# Patient Record
Sex: Female | Born: 1955 | Race: Black or African American | Hispanic: No | Marital: Married | Smoking: Never smoker
Health system: Southern US, Community
[De-identification: ages and names within clinical notes are randomized; demographics above are authoritative.]

## PROBLEM LIST (undated history)

## (undated) DIAGNOSIS — E669 Obesity, unspecified: Secondary | ICD-10-CM

## (undated) DIAGNOSIS — K429 Umbilical hernia without obstruction or gangrene: Secondary | ICD-10-CM

## (undated) DIAGNOSIS — I10 Essential (primary) hypertension: Secondary | ICD-10-CM

## (undated) DIAGNOSIS — K589 Irritable bowel syndrome without diarrhea: Secondary | ICD-10-CM

## (undated) HISTORY — DX: Obesity, unspecified: E66.9

## (undated) HISTORY — PX: WISDOM TOOTH EXTRACTION: SHX21

## (undated) HISTORY — PX: COLONOSCOPY: SHX174

## (undated) HISTORY — PX: LASER ABLATION OF THE CERVIX: SHX1949

## (undated) HISTORY — DX: Irritable bowel syndrome, unspecified: K58.9

## (undated) HISTORY — PX: ABDOMINAL HYSTERECTOMY: SHX81

## (undated) HISTORY — DX: Umbilical hernia without obstruction or gangrene: K42.9

## (undated) HISTORY — DX: Essential (primary) hypertension: I10

---

## 1999-03-30 ENCOUNTER — Other Ambulatory Visit: Admission: RE | Admit: 1999-03-30 | Discharge: 1999-03-30 | Payer: Self-pay | Admitting: *Deleted

## 2000-05-19 ENCOUNTER — Other Ambulatory Visit: Admission: RE | Admit: 2000-05-19 | Discharge: 2000-05-19 | Payer: Self-pay | Admitting: *Deleted

## 2000-09-13 ENCOUNTER — Encounter: Admission: RE | Admit: 2000-09-13 | Discharge: 2000-12-12 | Payer: Self-pay | Admitting: *Deleted

## 2004-08-25 ENCOUNTER — Other Ambulatory Visit: Admission: RE | Admit: 2004-08-25 | Discharge: 2004-08-25 | Payer: Self-pay | Admitting: Family Medicine

## 2005-02-20 ENCOUNTER — Emergency Department (HOSPITAL_COMMUNITY): Admission: EM | Admit: 2005-02-20 | Discharge: 2005-02-20 | Payer: Self-pay | Admitting: Emergency Medicine

## 2008-06-03 ENCOUNTER — Encounter: Admission: RE | Admit: 2008-06-03 | Discharge: 2008-06-03 | Payer: Self-pay | Admitting: Obstetrics

## 2008-10-18 ENCOUNTER — Encounter (INDEPENDENT_AMBULATORY_CARE_PROVIDER_SITE_OTHER): Payer: Self-pay | Admitting: Obstetrics & Gynecology

## 2008-10-18 ENCOUNTER — Ambulatory Visit (HOSPITAL_COMMUNITY): Admission: RE | Admit: 2008-10-18 | Discharge: 2008-10-18 | Payer: Self-pay | Admitting: Obstetrics & Gynecology

## 2009-04-25 ENCOUNTER — Emergency Department (HOSPITAL_COMMUNITY): Admission: EM | Admit: 2009-04-25 | Discharge: 2009-04-25 | Payer: Self-pay | Admitting: Emergency Medicine

## 2009-06-20 ENCOUNTER — Inpatient Hospital Stay (HOSPITAL_BASED_OUTPATIENT_CLINIC_OR_DEPARTMENT_OTHER): Admission: RE | Admit: 2009-06-20 | Discharge: 2009-06-20 | Payer: Self-pay | Admitting: Cardiology

## 2011-03-23 LAB — URINALYSIS, ROUTINE W REFLEX MICROSCOPIC
Bilirubin Urine: NEGATIVE
Glucose, UA: NEGATIVE mg/dL
Protein, ur: NEGATIVE mg/dL
Urobilinogen, UA: 0.2 mg/dL (ref 0.0–1.0)
pH: 5.5 (ref 5.0–8.0)

## 2011-03-23 LAB — POCT I-STAT, CHEM 8
BUN: 10 mg/dL (ref 6–23)
Calcium, Ion: 1.2 mmol/L (ref 1.12–1.32)
Chloride: 103 mEq/L (ref 96–112)
Glucose, Bld: 114 mg/dL — ABNORMAL HIGH (ref 70–99)
HCT: 44 % (ref 36.0–46.0)
Potassium: 3.5 mEq/L (ref 3.5–5.1)

## 2011-03-23 LAB — CBC
HCT: 40.3 % (ref 36.0–46.0)
MCHC: 33 g/dL (ref 30.0–36.0)
Platelets: 283 10*3/uL (ref 150–400)
RDW: 14.5 % (ref 11.5–15.5)
WBC: 11 10*3/uL — ABNORMAL HIGH (ref 4.0–10.5)

## 2011-03-23 LAB — DIFFERENTIAL
Basophils Absolute: 0.1 10*3/uL (ref 0.0–0.1)
Basophils Relative: 1 % (ref 0–1)
Eosinophils Absolute: 0.1 10*3/uL (ref 0.0–0.7)
Lymphs Abs: 1.7 10*3/uL (ref 0.7–4.0)
Monocytes Absolute: 0.5 10*3/uL (ref 0.1–1.0)
Monocytes Relative: 4 % (ref 3–12)
Neutrophils Relative %: 79 % — ABNORMAL HIGH (ref 43–77)

## 2011-03-23 LAB — TROPONIN I: Troponin I: 0.01 ng/mL (ref 0.00–0.06)

## 2011-03-23 LAB — POCT CARDIAC MARKERS

## 2011-03-23 LAB — URINE CULTURE: Colony Count: NO GROWTH

## 2011-03-23 LAB — CK TOTAL AND CKMB (NOT AT ARMC)
CK, MB: 0.9 ng/mL (ref 0.3–4.0)
Relative Index: 0.8 (ref 0.0–2.5)

## 2011-03-31 ENCOUNTER — Encounter (HOSPITAL_COMMUNITY)
Admission: RE | Admit: 2011-03-31 | Discharge: 2011-03-31 | Disposition: A | Discharge status: Home / Self Care | Payer: BC Managed Care – HMO | Source: Ambulatory Visit | Attending: Obstetrics & Gynecology | Admitting: Obstetrics & Gynecology

## 2011-03-31 LAB — BASIC METABOLIC PANEL
Calcium: 9 mg/dL (ref 8.4–10.5)
Creatinine, Ser: 0.44 mg/dL (ref 0.4–1.2)
GFR calc non Af Amer: >60 mL/min (ref >60)
Sodium: 141 mEq/L (ref 135–145)

## 2011-03-31 LAB — SURGICAL PCR SCREEN: MRSA, PCR: NEGATIVE

## 2011-03-31 LAB — CBC
HCT: 36.8 % (ref 36.0–46.0)
MCHC: 32.3 g/dL (ref 30.0–36.0)
Platelets: 246 10*3/uL (ref 150–400)
WBC: 6 10*3/uL (ref 4.0–10.5)

## 2011-04-05 ENCOUNTER — Other Ambulatory Visit: Payer: Self-pay | Admitting: Obstetrics & Gynecology

## 2011-04-05 ENCOUNTER — Ambulatory Visit (HOSPITAL_COMMUNITY)
Admission: RE | Admit: 2011-04-05 | Discharge: 2011-04-06 | Disposition: A | Discharge status: Home / Self Care | Payer: BC Managed Care – HMO | Source: Ambulatory Visit | Attending: Obstetrics & Gynecology | Admitting: Obstetrics & Gynecology

## 2011-04-05 DIAGNOSIS — K429 Umbilical hernia without obstruction or gangrene: Secondary | ICD-10-CM | POA: Insufficient documentation

## 2011-04-05 DIAGNOSIS — N949 Unspecified condition associated with female genital organs and menstrual cycle: Secondary | ICD-10-CM | POA: Insufficient documentation

## 2011-04-05 DIAGNOSIS — N920 Excessive and frequent menstruation with regular cycle: Secondary | ICD-10-CM | POA: Insufficient documentation

## 2011-04-05 DIAGNOSIS — D259 Leiomyoma of uterus, unspecified: Secondary | ICD-10-CM | POA: Insufficient documentation

## 2011-04-05 LAB — PREGNANCY, URINE: Preg Test, Ur: NEGATIVE

## 2011-04-05 LAB — GLUCOSE, CAPILLARY
Glucose-Capillary: 167 mg/dL — ABNORMAL HIGH (ref 70–99)
Glucose-Capillary: 213 mg/dL — ABNORMAL HIGH (ref 70–99)

## 2011-04-06 LAB — TYPE AND SCREEN

## 2011-04-27 NOTE — Cardiovascular Report (Signed)
[name], [name]          ACCOUNT NO.:  [id number]   MEDICAL RECORD NO.:  [id number]          PATIENT TYPE:  OIB   LOCATION:  1961                         FACILITY:  [facility]   PHYSICIAN:  Jake Bathe, MD      DATE OF BIRTH:  1956-06-08   DATE OF PROCEDURE:  06/20/2009  DATE OF DISCHARGE:  06/20/2009                            CARDIAC CATHETERIZATION   PROCEDURES:  1. Left heart catheterization.  2. Selective coronary angiography.  3. Left ventriculogram.  4. Abdominal aortogram.  5. Unilateral arteriogram of right radial artery.   PROCEDURE DETAILS:  Informed consent was obtained.  Risk of stroke,  heart attack, death, renal impairment, impairment of radial artery,  damage to artery and hand have been explained to the patient at length.  She was prepped in a sterile fashion and placed on the catheterization  table.  Her right radial artery was prepped initially.  Lidocaine 1% was  used for local anesthesia.  A 5-French hydrophilic sheath was easily  placed into the right radial artery.  Verapamil 3 mg were administered.  A Rosen wire was used and had a difficulty at the brachial junction.  An  arteriogram was performed using Omnipaque.  This demonstrated a radial  loop and a recurrent radial artery.  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  This was also discussed with Dr. Excell Seltzer.  At  this time, radial artery catheterization was discontinued.  Her radial  artery sheath was removed and RadStat was applied.  She had good oxygen  saturation per pulse oximetry on phone.  No forearm pain.   The right groin was then prepped.  Lidocaine 1% was used for local  anesthesia after first visualization of femoral head.  A 4-French sheath  was placed in the right femoral artery with ease using the modified  Seldinger technique.  A Judkins left #4 catheter and a new torque  Williams right catheter was used for visualization of the coronary  arteries.  Multiple views  of the Omnipaque were obtained.  An angled  pigtail was used to cross the left ventricle.  Power injection in the  RAO position utilizing 30 mL of contrast was obtained.  Ascending aortic  pressure was obtained.  The catheter was then placed at the level of L1  spine and an abdominal aortogram was then performed utilizing 30 mL of  contrast.  Following the procedure, the right femoral groin sheath was  removed.  Pressure maintained.  She tolerated the procedures well.  No  complaints.   FINDINGS:  No angiographically significant coronary artery disease  throughout all coronary arteries.  Normal anatomic distribution.  LAD  gives rise to one large diagonal branch.  Circumflex gives rise to one  large obtuse marginal branch.  The right coronary artery is the dominant  artery giving rise to the PDA.  No angiographically significant coronary  artery disease.  Left ventriculogram demonstrated normal ejection  fraction of 65% with no wall motion abnormalities present.  There was no  appreciable mitral regurgitation.  Left ventricular pressure was 127  with an end-diastolic pressure of 30 mmHg,  aortic pressure was 177/99  with a mean of 131 mmHg.   Abdominal aortogram demonstrated no renal artery stenosis and no  abdominal aortic aneurysm.   Arteriogram of right arm.  There was a radial loop identified.  There  was stenosis in the recurrent radial artery leading up her forearm upper  upper extremity.   IMPRESSION:  1. No angiographically significant coronary artery disease.  2. Normal left ventricular ejection fraction of 65%.  3. Elevated blood pressure/hypertension with left ventricular end-      diastolic pressure of 30 mmHg consistent with diastolic      dysfunction/hypertension.  4. No abdominal aortic aneurysm.  5. No evidence of renal artery stenosis given her hypertension.  6. Radial arteriogram as described above.   PLAN:  She will follow up with me in clinic.  Continue with  aggressive  risk factor modification which includes an aggressive blood pressure  control.  Hydralazine 10 mg has been administered for sheath pull.      Jake Bathe, MD  Electronically Signed     MCS/MEDQ  D:  06/20/2009  T:  06/21/2009  Job:  [id number]   cc:   Dario Guardian, M.D.

## 2011-04-27 NOTE — Op Note (Signed)
[name], [name]          ACCOUNT NO.:  [id number]   MEDICAL RECORD NO.:  [id number]          PATIENT TYPE:  AMB   LOCATION:  SDC                           FACILITY:  [facility]   PHYSICIAN:  Genia Del, M.D.DATE OF BIRTH:  05/11/1956   DATE OF PROCEDURE:  10/18/2008  DATE OF DISCHARGE:                               OPERATIVE REPORT   PREOPERATIVE DIAGNOSES:  Menorrhagia and intrauterine lesion.   POSTOPERATIVE DIAGNOSES:  Menorrhagia and intrauterine lesion.  No  endometrial polyp.   PROCEDURE:  Hysteroscopy, dilatation and curettage, NovaSure endometrial  ablation.   SURGEON:  Genia Del, MD   ANESTHESIOLOGIST:  Belva Agee, MD   DESCRIPTION OF PROCEDURE:  Under general anesthesia with endotracheal  intubation, the patient is in lithotomy position.  She is prepped with  Betadine on the suprapubic, vulvar, and vaginal areas and draped as  usual.  The vaginal exam under general anesthesia reveals an anteverted  uterus, no adnexal mass.  The speculum was introduced in the vagina.  The anterior lip of the cervix was grasped with a tenaculum.  A  paracervical block is done with Nesacaine 1%, 20 mL total, at 4 and 8  o'clock.  We then dilated the cervix with Hegar dilators up to #31  without difficulty.  We then measured the cervical length, which is 5  cm, and the hysterometry at 10 cm for a cavity length of 5 cm.  We then  proceeded with the hysteroscopy, revealing a normal intrauterine cavity.  Both ostia are visualized and pictures are taken.  No endometrial polyp  is present.  We therefore removed the hysteroscope, proceed with  curettage of the intrauterine cavity systematically on all surfaces with  a sharp curette.  The endometrial curettings are sent to pathology.  We  then insert the NovaSure instrument in the intrauterine cavity.  We  measure the width, which is at 3.8 cm.  We do the integrity test, which  is passed, and proceed with the ablation.   The duration is 45 seconds at  a power of 105.  The instrument is then removed without any difficulty.  All instruments are removed and hemostasis is adequate.  The estimated  blood loss was minimal.  No complications occurred, and the patient was  brought to the recovery room in good stable status.      Genia Del, M.D.  Electronically Signed     ML/MEDQ  D:  10/18/2008  T:  10/18/2008  Job:  [id number]

## 2011-05-12 NOTE — Op Note (Signed)
[name], [name]          ACCOUNT NO.:  [id number]  MEDICAL RECORD NO.:  [id number]           PATIENT TYPE:  O  LOCATION:  9310                          FACILITY:  [facility]  PHYSICIAN:  Genia Del, M.D.DATE OF BIRTH:  Mar 26, 1956  DATE OF PROCEDURE:  04/05/2011 DATE OF DISCHARGE:                              OPERATIVE REPORT   The patient came to [facility] for surgery on April 05, 2011.  PREOPERATIVE DIAGNOSIS:  Symptomatic large uterine myomas with menorrhagia and pelvic pain.  POSTOPERATIVE DIAGNOSES:  Symptomatic large uterine myomas with menorrhagia and pelvic pain, omental and bowel adhesions, and a less than 2-cm umbilical hernia.  PROCEDURE:  Total laparoscopy hysterectomy assisted with da Vinci with lysis of adhesions, repair of the umbilical hernia, and morcellation of the uterus.  SURGEON:  Genia Del, MD  ASSISTANT:  Maxie Better, MD  PROCEDURE:  Under general anesthesia with endotracheal intubation, the patient is in lithotomy position.  She is prepped with Surgi-Prep on the abdomen and with Betadine on the suprapubic vulvar and vaginal areas.  The patient is draped as usual.  The Foley is put in place in the bladder.  The vaginal exam reveals a large irregular uterus corresponding to about 15 weeks.  It is mobile, no adnexal mass, otherwise, felt.  We inserted the weighted speculum in the vagina.  We grasped the anterior lip of the cervix with a tenaculum.  We did hysterometry which is at 12 cm.  We dilated the cervix with Hegar dilators up to #29 without difficulty.  We then used a #10 RUMI with the medium Koh ring, this was put in place without difficulty.  The other instruments were then removed.  We go to the abdomen, we assured that a nasogastric tube is in place and draining well.  We made a very small skin incision with a scalpel below the left lower ribs.  We inserted the Veress needle at that level.  The security test was  done and we created pneumoperitoneum with CO2.  The pressures are normal.  We then removed the Veress needle and inserted a 5-mm trocar.  We then used a 5-mm camera at that level.  We inspected the abdominopelvic cavities.  We note the omentum inserted at the umbilical site, a small umbilical hernia is present.  Otherwise, the uterus is very irregular and enlarged with fibroids.  Filmy adhesions between the bowels and the uterus and the pelvic wall are present.  We measured the port entries, we will use an M configuration with two robotic ports on the right, one robotic port on the left, and the camera at the supraumbilical area.  We marked the skin.  We then infiltrated Marcaine 0.25 plain.  We inserted the three robotic ports under direct vision with a 5-mm camera.  We then used laparoscopic instruments to free the omentum at the supraumbilical area. We used a clamp and the hot scissors.  Once this was completed, we made a 10-cm incision at the supraumbilical area and entered the camera port under direct vision.  We then docked the robot on the right side.  We put the camera and the instruments  in place.  We used Trendelenburg as required, not maximum.  The instruments were inserted, the Endoshears scissor in the right and the PK in the left and the Cobra in the third arm.  Those instruments were connected.  We started at the console, went first to the right adnexa, visualized the ureter which was in normal location, released the filmy adhesions between the bowels and the right ovary, cauterized and sectioned the right round ligament, the right tube, and the right utero-ovarian ligament and went down and stopped just before the uterine artery.  We opened the visceral peritoneum anteriorly.  We proceeded then to the left side, cauterized, and sectioned the left tube, left utero-ovarian ligament, and left round ligament, visualized the left ureter which was in normal location.  We then  descended on the left lateral side of the uterus, opened the anterior visceral peritoneum, lowered the bladder past the [facility] ring.  We then cauterized and sectioned the left uterine artery followed by the right uterine artery.  The uterus was blanching well.  We then opened the colpotomy with the point of the Endoshears scissors, starting anteriorly going to the right side and then posteriorly finishing on the left side.  Once the uterus was completely detached, the [facility] ring was removed and the uterus was put in the right gutter.  The instruments were switched to the cutting needle driver in the right hand, the regular needle driver in the left and the PK in the third arm.  We used a V-lock, a suture, to close the colpotomy.  This suture was introduced through the suprapubic camera port under direct vision with a 5-mm camera and the assistant port.  We suctioned the pelvic cavity and started from the left angle to the right coming back with V-lock more than a half way to the other side.  Good bites were taken and the vaginal mucosa was included.  We had good hemostasis.  Hemostasis was verified also on the adnexa and was adequate.  We parked the needle on the right gutter peritoneum.  We removed the robotic instruments, undocked the robot, and went by laparoscopy.  First, we removed the needle through the suprapubic camera port.  We then removed that port and switched to the morcellator at that level and the 5-mm camera in the assistant port.  We morcellated the uterus easily.  The specimens weight was 501 g, and we sent it to Pathology.  We then irrigated and suctioned the abdominopelvic cavities, assured that no piece of myoma was left in. We reverified hemostasis which was adequate.  We removed all instruments, evacuated the CO2.  We closed fast the supraumbilical incision.  We repaired the hernia at that level closing with figure-of- eights of Vicryl 0.  No mesh was used given the small  size of the hernia.  We then closed all incisions with a Vicryl 4-0 and a subcuticular stitch and added Dermabond.  The occluder was removed from the vagina.  The estimated blood loss was 125 mL.  The patient was kept on the dry side during surgery.  Note that she received Flagyl and gentamicin IV before induction.  No complications occurred, and she was transferred to recovery room in good stable status.     Genia Del, M.D.     ML/MEDQ  D:  04/05/2011  T:  04/06/2011  Job:  [id number]  Electronically Signed by Genia Del M.D. on 05/12/2011 05:03:48 PM

## 2011-09-14 LAB — COMPREHENSIVE METABOLIC PANEL
Albumin: 3.5
BUN: 10
Calcium: 9.2
Creatinine, Ser: 0.68
Total Bilirubin: 0.5
Total Protein: 6.4

## 2011-09-14 LAB — CBC
HCT: 36.9
MCHC: 32.1
MCV: 88.1
Platelets: 280
RDW: 14.3

## 2011-09-22 ENCOUNTER — Encounter (INDEPENDENT_AMBULATORY_CARE_PROVIDER_SITE_OTHER): Payer: Self-pay | Admitting: General Surgery

## 2011-09-22 ENCOUNTER — Ambulatory Visit (INDEPENDENT_AMBULATORY_CARE_PROVIDER_SITE_OTHER): Payer: BC Managed Care – HMO | Admitting: General Surgery

## 2011-09-22 VITALS — BP 132/96 | HR 60 | Temp 96.8°F | Resp 20 | Ht 68.0 in | Wt 261.0 lb

## 2011-09-22 DIAGNOSIS — K429 Umbilical hernia without obstruction or gangrene: Secondary | ICD-10-CM | POA: Insufficient documentation

## 2011-09-22 NOTE — Progress Notes (Signed)
Chief Complaint  Patient presents with  . Other    eval umbilical hernia     HPI [name] is a 55 y.o. female.   HPI  She is referred by Dr. Katrinka Blazing for evaluation of an umbilical hernia. She had a robotic hysterectomy in April of this year. An umbilical hernia was found at that time and repaired with Vicryl suture. She first noted an intermittent bulge that was intermittently uncomfortable. The bulge is persistent now.  It has been present since her surgery in April. There are no obstructive symptoms. It has increased in size.  Past Medical History  Diagnosis Date  . Diabetes mellitus   . Hypertension   . Obesity   . IBS (irritable bowel syndrome)     Past Surgical History  Procedure Date  . Abdominal hysterectomy APril j23, 2012  . Colonoscopy   . Laser ablation of the cervix   . Wisdom tooth extraction     History reviewed. No pertinent family history.  Social History History  Substance Use Topics  . Smoking status: Never Smoker   . Smokeless tobacco: Never Used  . Alcohol Use: No    Allergies  Allergen Reactions  . Amoxicillin     Hives- itching and swelling     Current Outpatient Prescriptions  Medication Sig Dispense Refill  . amLODipine (NORVASC) 10 MG tablet       . BIOTIN PO Take by mouth daily.        Marland Kitchen JANUMET 50-1000 MG per tablet       . losartan-hydrochlorothiazide (HYZAAR) 100-25 MG per tablet       . Multiple Vitamin (MULTIVITAMIN PO) Take by mouth daily.        Marland Kitchen SIMVASTATIN PO Take by mouth daily.          Review of Systems Review of Systems  Constitutional: Positive for unexpected weight change.  Respiratory: Negative.   Cardiovascular: Negative.   Gastrointestinal:       Has intermittent diarrhea and nausea.    Blood pressure 132/96, pulse 60, temperature 96.8 F (36 C), resp. rate 20, height 5\' 8"  (1.727 m), weight 261 lb (118.389 kg).  Physical Exam Physical Exam  Constitutional: No distress.       Obese.  Neck: Neck  supple.  Cardiovascular: Normal rate and regular rhythm.   No murmur heard. Pulmonary/Chest: Effort normal and breath sounds normal.  Abdominal: Soft. She exhibits no distension. There is no tenderness.       There is an umbilical bulge that is reducible in the supine position. Small abdominal scars are noted.  Musculoskeletal: Normal range of motion. She exhibits no edema.  Lymphadenopathy:    She has no cervical adenopathy.    Data Reviewed Notes from Dr. Michaelle Copas office.  Assessment    Symptomatic umbilical hernia. She is interested in repair. I think this is a better alternative for her than nonoperative management. I feel operative management will help solve this problem.    Plan    I have discussed the procedure, risks, and aftercare.  Risks include but are not limited to bleeding, infection, wound problems, anesthesia, recurrence, injury to intestine, mesh problems.  We also discussed not knowing what the umbilicus would look like in the future.  She seems to understand and agrees with the plan.       Xaviar Lunn J 09/22/2011, 12:29 PM

## 2011-10-07 ENCOUNTER — Ambulatory Visit (HOSPITAL_COMMUNITY)
Admission: RE | Admit: 2011-10-07 | Discharge: 2011-10-07 | Disposition: A | Discharge status: Home / Self Care | Payer: BC Managed Care – PPO | Source: Ambulatory Visit | Attending: General Surgery | Admitting: General Surgery

## 2011-10-07 ENCOUNTER — Encounter (HOSPITAL_COMMUNITY): Payer: BC Managed Care – PPO

## 2011-10-07 ENCOUNTER — Other Ambulatory Visit (INDEPENDENT_AMBULATORY_CARE_PROVIDER_SITE_OTHER): Payer: Self-pay | Admitting: General Surgery

## 2011-10-07 DIAGNOSIS — Z01812 Encounter for preprocedural laboratory examination: Secondary | ICD-10-CM | POA: Insufficient documentation

## 2011-10-07 DIAGNOSIS — Z01818 Encounter for other preprocedural examination: Secondary | ICD-10-CM

## 2011-10-07 LAB — COMPREHENSIVE METABOLIC PANEL
ALT: 19 U/L (ref 0–35)
Alkaline Phosphatase: 62 U/L (ref 39–117)
CO2: 30 mEq/L (ref 19–32)
Calcium: 10.5 mg/dL (ref 8.4–10.5)
GFR calc Af Amer: >90 mL/min (ref >90)
GFR calc non Af Amer: >90 mL/min (ref >90)
Glucose, Bld: 136 mg/dL — ABNORMAL HIGH (ref 70–99)
Sodium: 141 mEq/L (ref 135–145)

## 2011-10-07 LAB — CBC
HCT: 39.2 % (ref 36.0–46.0)
Hemoglobin: 12.7 g/dL (ref 12.0–15.0)
MCV: 84.8 fL (ref 78.0–100.0)
Platelets: 242 10*3/uL (ref 150–400)
RBC: 4.62 MIL/uL (ref 3.87–5.11)
WBC: 6 10*3/uL (ref 4.0–10.5)

## 2011-10-07 LAB — DIFFERENTIAL
Eosinophils Relative: 2 % (ref 0–5)
Lymphocytes Relative: 29 % (ref 12–46)
Lymphs Abs: 1.7 10*3/uL (ref 0.7–4.0)

## 2011-10-07 LAB — SURGICAL PCR SCREEN: MRSA, PCR: NEGATIVE

## 2011-10-11 ENCOUNTER — Ambulatory Visit (HOSPITAL_COMMUNITY)
Admission: RE | Admit: 2011-10-11 | Discharge: 2011-10-11 | Disposition: A | Discharge status: Home / Self Care | Payer: BC Managed Care – PPO | Source: Ambulatory Visit | Attending: General Surgery | Admitting: General Surgery

## 2011-10-11 DIAGNOSIS — K589 Irritable bowel syndrome without diarrhea: Secondary | ICD-10-CM | POA: Insufficient documentation

## 2011-10-11 DIAGNOSIS — K432 Incisional hernia without obstruction or gangrene: Secondary | ICD-10-CM

## 2011-10-11 DIAGNOSIS — Z01818 Encounter for other preprocedural examination: Secondary | ICD-10-CM | POA: Insufficient documentation

## 2011-10-11 DIAGNOSIS — I10 Essential (primary) hypertension: Secondary | ICD-10-CM | POA: Insufficient documentation

## 2011-10-11 DIAGNOSIS — K429 Umbilical hernia without obstruction or gangrene: Secondary | ICD-10-CM | POA: Insufficient documentation

## 2011-10-11 DIAGNOSIS — E119 Type 2 diabetes mellitus without complications: Secondary | ICD-10-CM | POA: Insufficient documentation

## 2011-10-11 DIAGNOSIS — Z79899 Other long term (current) drug therapy: Secondary | ICD-10-CM | POA: Insufficient documentation

## 2011-10-11 DIAGNOSIS — E669 Obesity, unspecified: Secondary | ICD-10-CM | POA: Insufficient documentation

## 2011-10-11 DIAGNOSIS — Z01812 Encounter for preprocedural laboratory examination: Secondary | ICD-10-CM | POA: Insufficient documentation

## 2011-10-11 HISTORY — PX: HERNIA REPAIR: SHX51

## 2011-10-14 NOTE — Op Note (Signed)
[name], [name]          ACCOUNT NO.:  [id number]  MEDICAL RECORD NO.:  [id number]  LOCATION:  DAYL                         FACILITY:  [facility]  PHYSICIAN:  Adolph Pollack, M.D.DATE OF BIRTH:  05-Jul-1956  DATE OF PROCEDURE: DATE OF DISCHARGE:                              OPERATIVE REPORT   PREOPERATIVE DIAGNOSIS:  Recurrent umbilical hernia.  POSTOPERATIVE DIAGNOSIS:  Recurrent umbilical hernia.  PROCEDURE:  Repair of recurrent umbilical hernia with mesh.  SURGEON:  Adolph Pollack, M.D..  ANESTHESIA:  General plus Marcaine local (0.5%).  INDICATION:  [name] is a 55 year old female who underwent a robotic hysterectomy and had the repair of an umbilical hernia at that time.  The hernia has recurred and has intermittently symptomatic.  She now presents for repair.  We discussed the success rate, procedure and risks preoperatively as well as the aftercare.  TECHNIQUE:  She was seen in the holding area then brought to the operating room, placed supine on the operating table, and a general anesthetic was administered.  The abdominal wall was sterilely prepped and draped.  Local anesthetic was infiltrated in the periumbilical region superficially and deep.  A transverse subumbilical incision was made through the skin and subcutaneous tissue, and I encountered the hernia sac at this time.  I dissected the hernia sac free from the subcutaneous tissue and identified the fascia around it.  I dissected subcutaneous tissue off the fascia laterally and inferiorly for direction approximately 3 cm away from the hernia sac.  I then isolated the umbilical stalk and dissected it free from the fascia exposing the hernia.  I dissected subcutaneous tissue free from the superior fascia for distance of these 3 cm around the hernia defect.  I was then able to reduce the hernia sac and contents back through the fascia.  I then primarily closed the fascia with interrupted 0  Surgilon sutures leaving these long.  A piece of 3 x 6 inch polypropylene mesh was brought into the field and cut so I would have 3-4 cm overlap in all directions.  More subcutaneous tissue was dissected free from the fascia giving a 3-4 cm area of overlap all around the primary repair.  I thread the primary repair sutures through the mesh and tied them down, anchoring the mesh directly over the primary repair.  The periphery of the mesh was then anchored to the fascia with a running 0 Prolene suture.  Excess mesh was removed.  This provided for good coverage as well as good overlap of the hernia defect.  Following this, I injected local anesthetic into the fashion and inspected the wound, and hemostasis was adequate.  The umbilicus was reimplanted on the mesh with 3-0 Vicryl suture.  The subcutaneous tissue was closed over the mesh with a running 3-0 Vicryl suture.  The skin was closed with 4-0 Monocryl subcuticular stitch.  Steri-Strips and sterile dressing were applied.  She tolerated the procedure well without any apparent complications and was taken to the recovery room in satisfactory condition.     Adolph Pollack, M.D.     Kari Baars  D:  10/11/2011  T:  10/11/2011  Job:  [id number]  cc:   Dario Guardian, M.D.  Fax: [phone]  Electronically Signed by Avel Peace M.D. on 10/14/2011 09:05:28 PM

## 2011-11-02 ENCOUNTER — Ambulatory Visit (INDEPENDENT_AMBULATORY_CARE_PROVIDER_SITE_OTHER): Payer: BC Managed Care – PPO | Admitting: General Surgery

## 2011-11-02 ENCOUNTER — Encounter (INDEPENDENT_AMBULATORY_CARE_PROVIDER_SITE_OTHER): Payer: Self-pay | Admitting: General Surgery

## 2011-11-02 VITALS — BP 130/86 | HR 68 | Temp 98.2°F | Resp 12 | Ht 68.0 in | Wt 259.2 lb

## 2011-11-02 DIAGNOSIS — Z9889 Other specified postprocedural states: Secondary | ICD-10-CM

## 2011-11-02 NOTE — Patient Instructions (Signed)
May resume normal activities as tolerated six weeks after your surgery date.  May return to work on 11/22/2011.

## 2011-11-02 NOTE — Progress Notes (Signed)
She presents for postop followup after open recurrent umbilical hernia repair with mesh 10/11/11.Marland Kitchen  Post op pain is improving.  No difficulty voiding or having BMs.  Swelling is decreasing.  P.E.  ABD:  Soft, incision clean, dry and intact.  Hernia repair feels solid.  Assessment:  Doing well post hernia repair.  Plan:  Continue light activities for 6 weeks postop then slowly start to resume normal activities.  Avoid strenous abdominal exercises for a total of 8 weeks from surgery.  Avoid activities that cause significant discomfort for the long term.  Return visit as needed.

## 2014-02-02 ENCOUNTER — Emergency Department (HOSPITAL_COMMUNITY)
Admission: EM | Admit: 2014-02-02 | Discharge: 2014-02-02 | Disposition: A | Discharge status: Home / Self Care | Payer: BC Managed Care – PPO | Attending: Emergency Medicine | Admitting: Emergency Medicine

## 2014-02-02 ENCOUNTER — Encounter (HOSPITAL_COMMUNITY): Payer: Self-pay | Admitting: Emergency Medicine

## 2014-02-02 ENCOUNTER — Emergency Department (HOSPITAL_COMMUNITY): Payer: BC Managed Care – PPO

## 2014-02-02 DIAGNOSIS — N1330 Unspecified hydronephrosis: Secondary | ICD-10-CM | POA: Insufficient documentation

## 2014-02-02 DIAGNOSIS — N201 Calculus of ureter: Secondary | ICD-10-CM | POA: Insufficient documentation

## 2014-02-02 DIAGNOSIS — E669 Obesity, unspecified: Secondary | ICD-10-CM | POA: Insufficient documentation

## 2014-02-02 DIAGNOSIS — E119 Type 2 diabetes mellitus without complications: Secondary | ICD-10-CM | POA: Insufficient documentation

## 2014-02-02 DIAGNOSIS — Z79899 Other long term (current) drug therapy: Secondary | ICD-10-CM | POA: Insufficient documentation

## 2014-02-02 DIAGNOSIS — I10 Essential (primary) hypertension: Secondary | ICD-10-CM | POA: Insufficient documentation

## 2014-02-02 DIAGNOSIS — N132 Hydronephrosis with renal and ureteral calculous obstruction: Secondary | ICD-10-CM

## 2014-02-02 DIAGNOSIS — Z7982 Long term (current) use of aspirin: Secondary | ICD-10-CM | POA: Insufficient documentation

## 2014-02-02 DIAGNOSIS — Z8719 Personal history of other diseases of the digestive system: Secondary | ICD-10-CM | POA: Insufficient documentation

## 2014-02-02 LAB — COMPREHENSIVE METABOLIC PANEL
ALBUMIN: 4.1 g/dL (ref 3.5–5.2)
ALT: 26 U/L (ref 0–35)
AST: 18 U/L (ref 0–37)
Alkaline Phosphatase: 64 U/L (ref 39–117)
BILIRUBIN TOTAL: 0.3 mg/dL (ref 0.3–1.2)
BUN: 19 mg/dL (ref 6–23)
CHLORIDE: 102 meq/L (ref 96–112)
CO2: 28 meq/L (ref 19–32)
CREATININE: 0.8 mg/dL (ref 0.50–1.10)
Calcium: 10.1 mg/dL (ref 8.4–10.5)
GFR calc Af Amer: >90 mL/min (ref >90)
GFR, EST NON AFRICAN AMERICAN: 81 mL/min — AB (ref >90)
Glucose, Bld: 169 mg/dL — ABNORMAL HIGH (ref 70–99)
POTASSIUM: 3.8 meq/L (ref 3.7–5.3)
SODIUM: 144 meq/L (ref 137–147)
Total Protein: 7.6 g/dL (ref 6.0–8.3)

## 2014-02-02 LAB — CBC WITH DIFFERENTIAL/PLATELET
BASOS ABS: 0 10*3/uL (ref 0.0–0.1)
BASOS PCT: 0 % (ref 0–1)
Eosinophils Absolute: 0.1 10*3/uL (ref 0.0–0.7)
Eosinophils Relative: 1 % (ref 0–5)
HCT: 38.6 % (ref 36.0–46.0)
Hemoglobin: 12.2 g/dL (ref 12.0–15.0)
LYMPHS PCT: 19 % (ref 12–46)
Lymphs Abs: 1.8 10*3/uL (ref 0.7–4.0)
MCH: 27.7 pg (ref 26.0–34.0)
MCHC: 31.6 g/dL (ref 30.0–36.0)
MCV: 87.5 fL (ref 78.0–100.0)
MONO ABS: 0.6 10*3/uL (ref 0.1–1.0)
Monocytes Relative: 6 % (ref 3–12)
NEUTROS ABS: 6.9 10*3/uL (ref 1.7–7.7)
NEUTROS PCT: 74 % (ref 43–77)
PLATELETS: 273 10*3/uL (ref 150–400)
RBC: 4.41 MIL/uL (ref 3.87–5.11)
RDW: 14 % (ref 11.5–15.5)
WBC: 9.3 10*3/uL (ref 4.0–10.5)

## 2014-02-02 LAB — URINALYSIS, ROUTINE W REFLEX MICROSCOPIC
BILIRUBIN URINE: NEGATIVE
GLUCOSE, UA: NEGATIVE mg/dL
Ketones, ur: NEGATIVE mg/dL
Leukocytes, UA: NEGATIVE
Nitrite: NEGATIVE
PROTEIN: NEGATIVE mg/dL
Specific Gravity, Urine: 1.021 (ref 1.005–1.030)
Urobilinogen, UA: 0.2 mg/dL (ref 0.0–1.0)
pH: 5 (ref 5.0–8.0)

## 2014-02-02 LAB — URINE MICROSCOPIC-ADD ON

## 2014-02-02 MED ORDER — ONDANSETRON HCL 4 MG/2ML IJ SOLN
4.0000 mg | Freq: Once | INTRAMUSCULAR | Status: AC
  Administered 2014-02-02: 4 mg via INTRAVENOUS

## 2014-02-02 MED ORDER — ONDANSETRON 4 MG PO TBDP: 4.0000 mg | ORAL_TABLET | Freq: Three times a day (TID) | ORAL | Status: DC | PRN

## 2014-02-02 MED ORDER — ONDANSETRON HCL 4 MG/2ML IJ SOLN
4.0000 mg | Freq: Once | INTRAMUSCULAR | Status: AC
  Filled 2014-02-02: qty 2

## 2014-02-02 MED ORDER — TAMSULOSIN HCL 0.4 MG PO CAPS: ORAL_CAPSULE | ORAL | Status: DC

## 2014-02-02 MED ORDER — TAMSULOSIN HCL 0.4 MG PO CAPS
0.4000 mg | ORAL_CAPSULE | Freq: Once | ORAL | Status: AC
  Administered 2014-02-02: 0.4 mg via ORAL
  Filled 2014-02-02: qty 1

## 2014-02-02 MED ORDER — HYDROMORPHONE HCL PF 1 MG/ML IJ SOLN
1.0000 mg | Freq: Once | INTRAMUSCULAR | Status: AC
Start: 2014-02-02 — End: 2014-02-02
  Administered 2014-02-02: 1 mg via INTRAVENOUS
  Filled 2014-02-02: qty 1

## 2014-02-02 MED ORDER — OXYCODONE-ACETAMINOPHEN 5-325 MG PO TABS: ORAL_TABLET | ORAL | Status: DC

## 2014-02-02 MED ORDER — ONDANSETRON 4 MG PO TBDP: 4.0000 mg | ORAL_TABLET | Freq: Three times a day (TID) | ORAL | Status: AC | PRN

## 2014-02-02 MED ORDER — HYDROMORPHONE HCL PF 1 MG/ML IJ SOLN
1.0000 mg | Freq: Once | INTRAMUSCULAR | Status: AC
  Administered 2014-02-02: 1 mg via INTRAVENOUS
  Filled 2014-02-02: qty 1

## 2014-02-02 NOTE — ED Notes (Signed)
Patient transported to CT 

## 2014-02-02 NOTE — Discharge Instructions (Signed)
Drink plenty of fluids. Take the percocet for pain. Use the zofran for nausea and take the flomax once a day to help you pass the stone. Return to the [facility] ED if you get a fever or have uncontrolled vomiting or pain. Otherwise, call Dr Lyndal Rainbow office to be rechecked this week.     Ureteral Colic (Kidney Stones) Ureteral colic is the result of a condition when kidney stones form inside the kidney. Once kidney stones are formed they may move into the tube that connects the kidney with the bladder (ureter). If this occurs, this condition may cause pain (colic) in the ureter.  CAUSES  Pain is caused by stone movement in the ureter and the obstruction caused by the stone. SYMPTOMS  The pain comes and goes as the ureter contracts around the stone. The pain is usually intense, sharp, and stabbing in character. The location of the pain may move as the stone moves through the ureter. When the stone is near the kidney the pain is usually located in the back and radiates to the belly (abdomen). When the stone is ready to pass into the bladder the pain is often located in the lower abdomen on the side the stone is located. At this location, the symptoms may mimic those of a urinary tract infection with urinary frequency. Once the stone is located here it often passes into the bladder and the pain disappears completely. TREATMENT   Your caregiver will provide you with medicine for pain relief.  You may require specialized follow-up X-rays.  The absence of pain does not always mean that the stone has passed. It may have just stopped moving. If the urine remains completely obstructed, it can cause loss of kidney function or even complete destruction of the involved kidney. It is your responsibility and in your interest that X-rays and follow-ups as suggested by your caregiver are completed. Relief of pain without passage of the stone can be associated with severe damage to the kidney, including loss of  kidney function on that side.  If your stone does not pass on its own, additional measures may be taken by your caregiver to ensure its removal. HOME CARE INSTRUCTIONS   Increase your fluid intake. Water is the preferred fluid since juices containing vitamin C may acidify the urine making it less likely for certain stones (uric acid stones) to pass.  Strain all urine. A strainer will be provided. Keep all particulate matter or stones for your caregiver to inspect.  Take your pain medicine as directed.  Make a follow-up appointment with your caregiver as directed.  Remember that the goal is passage of your stone. The absence of pain does not mean the stone is gone. Follow your caregiver's instructions.  Only take over-the-counter or prescription medicines for pain, discomfort, or fever as directed by your caregiver. SEEK MEDICAL CARE IF:   Pain cannot be controlled with the prescribed medicine.  You have a fever.  Pain continues for longer than your caregiver advises it should.  There is a change in the pain, and you develop chest discomfort or constant abdominal pain.  You feel faint or pass out. MAKE SURE YOU:   Understand these instructions.  Will watch your condition.  Will get help right away if you are not doing well or get worse. Document Released: 09/08/2005 Document Revised: 03/26/2013 Document Reviewed: 05/26/2011 [facility] Patient Information 2014 [place], [place].  Urine Strainer This strainer is used to catch or filter out any stones found in  your urine. Place the strainer under your urine stream. Save any stones or objects that you find in your urine. Place them in a plastic or glass container to show your caregiver. The stones vary in size - some can be very small, so make sure you check the strainer carefully. Your caregiver may send the stone to the lab. When the results are back, your caregiver may recommend medicines or diet changes.  Document Released:  09/03/2004 Document Revised: 02/21/2012 Document Reviewed: 10/11/2008 [facility] Patient Information 2014 [place].

## 2014-02-02 NOTE — ED Notes (Signed)
Per pt, right flank pain on and off for weeks-saw PCP and thought it was kidney stone-symptoms cleared but started back yesterday

## 2014-02-02 NOTE — ED Notes (Signed)
Patient is aware that we need a urine specimen-will let us know when she is able to urinate

## 2014-02-02 NOTE — ED Provider Notes (Signed)
CSN: [id number]     Arrival date & time 02/02/14  1047 History   First MD Initiated Contact with Patient 02/02/14 1118     Chief Complaint  Patient presents with  . right flank pain      (Consider location/radiation/quality/duration/timing/severity/associated sxs/prior Treatment) HPI Patient reports February 1 she had right lower quadrant pain radiating into her right flank with nausea and vomiting. She states it lasted a few hours and described it as a "10 out of 10" pain. She states it was like "labor pains". She states a few days later she had another episode that was not as bad and did not last as long. However at that time when she urinated she saw some blood in the toilet. She was seen by her PCP a couple days afterwards and they discussed this could be a kidney stone. She reports a third episode started this morning about 7 AM. She reports she has right lower quadrant pain radiating into her right flank. She has had nausea and vomiting today. She denies fever or hematuria. She states she has urinary urgency and has a constant need to urinate with minimal results. She states nothing she does makes the pain worse, nothing she does makes the pain feel better. She reports prior to this month she's never had this before. She states she has a moderate caffeine and milk drinker. She has no family history of kidney stones.  PCP Dr Erby Pian  Past Medical History  Diagnosis Date  . Diabetes mellitus   . Hypertension   . Obesity   . IBS (irritable bowel syndrome)   . Umbilical hernia    Past Surgical History  Procedure Laterality Date  . Abdominal hysterectomy  APril j23, 2012  . Colonoscopy    . Laser ablation of the cervix    . Wisdom tooth extraction    . Hernia repair  10/11/11    umb hernia   Family History  Problem Relation Age of Onset  . Cancer Mother     colon  . Heart disease Mother     heart attack   History  Substance Use Topics  . Smoking status: Never Smoker   .  Smokeless tobacco: Never Used  . Alcohol Use: No   unemployed  OB History   Grav Para Term Preterm Abortions TAB SAB Ect Mult Living                 Review of Systems  All other systems reviewed and are negative.      Allergies  Amoxicillin and Spinach  Home Medications   Current Outpatient Rx  Name  Route  Sig  Dispense  Refill  . aspirin EC 81 MG tablet   Oral   Take 81 mg by mouth at bedtime.         . Biotin 1000 MCG tablet   Oral   Take 1,000 mcg by mouth every morning.         Marland Kitchen estradiol (VIVELLE-DOT) 0.075 MG/24HR   Transdermal   Place 1 patch onto the skin 2 (two) times a week.         Marland Kitchen JANUMET 50-1000 MG per tablet   Oral   Take 1 tablet by mouth 2 (two) times daily with a meal.          . simvastatin (ZOCOR) 10 MG tablet   Oral   Take 10 mg by mouth at bedtime.         Marya Landry  40-5-25 MG TABS   Oral   Take 1 tablet by mouth every morning.           BP 149/81  Pulse 79  Temp(Src) 98.3 F (36.8 C) (Oral)  Resp 17  SpO2 96%  Vital signs normal   Physical Exam  Nursing note and vitals reviewed. Constitutional: She is oriented to person, place, and time. She appears well-developed and well-nourished.  Non-toxic appearance. She does not appear ill. She appears distressed.  Appears painful, has trouble sitting still  HENT:  Head: Normocephalic and atraumatic.  Right Ear: External ear normal.  Left Ear: External ear normal.  Nose: Nose normal. No mucosal edema or rhinorrhea.  Mouth/Throat: Oropharynx is clear and moist and mucous membranes are normal. No dental abscesses or uvula swelling.  Eyes: Conjunctivae and EOM are normal. Pupils are equal, round, and reactive to light.  Neck: Normal range of motion and full passive range of motion without pain. Neck supple.  Cardiovascular: Normal rate, regular rhythm and normal heart sounds.  Exam reveals no gallop and no friction rub.   No murmur heard. Pulmonary/Chest: Effort normal  and breath sounds normal. No respiratory distress. She has no wheezes. She has no rhonchi. She has no rales. She exhibits no tenderness and no crepitus.  Abdominal: Soft. Normal appearance and bowel sounds are normal. She exhibits no distension. There is no tenderness. There is no rebound and no guarding.    Area of pain noted  Musculoskeletal: Normal range of motion. She exhibits no edema and no tenderness.       Back:  Moves all extremities well.  Area of pain noted, nontender to palpation  Neurological: She is alert and oriented to person, place, and time. She has normal strength. No cranial nerve deficit.  Skin: Skin is warm, dry and intact. No rash noted. No erythema. No pallor.  Psychiatric: She has a normal mood and affect. Her speech is normal and behavior is normal. Her mood appears not anxious.    ED Course  Procedures (including critical care time)  Medications  ondansetron (ZOFRAN) injection 4 mg (0 mg Intravenous Duplicate 6/96/29 5284)  HYDROmorphone (DILAUDID) injection 1 mg (1 mg Intravenous Given 02/02/14 1212)  ondansetron (ZOFRAN) injection 4 mg (4 mg Intravenous Given 02/02/14 1200)  HYDROmorphone (DILAUDID) injection 1 mg (1 mg Intravenous Given 02/02/14 1445)  tamsulosin (FLOMAX) capsule 0.4 mg (0.4 mg Oral Given 02/02/14 1448)   13:45 pt given her CT results. Still waiting for UA. Pt still has pain  Pt left with Dr Jeanell Sparrow to review her UA, may need antibiotics.   Labs Review Results for orders placed during the hospital encounter of 02/02/14  CBC WITH DIFFERENTIAL      Result Value Ref Range   WBC 9.3  4.0 - 10.5 K/uL   RBC 4.41  3.87 - 5.11 MIL/uL   Hemoglobin 12.2  12.0 - 15.0 g/dL   HCT 38.6  36.0 - 46.0 %   MCV 87.5  78.0 - 100.0 fL   MCH 27.7  26.0 - 34.0 pg   MCHC 31.6  30.0 - 36.0 g/dL   RDW 14.0  11.5 - 15.5 %   Platelets 273  150 - 400 K/uL   Neutrophils Relative % 74  43 - 77 %   Neutro Abs 6.9  1.7 - 7.7 K/uL   Lymphocytes Relative 19  12 - 46 %     Lymphs Abs 1.8  0.7 - 4.0 K/uL   Monocytes Relative 6  3 - 12 %   Monocytes Absolute 0.6  0.1 - 1.0 K/uL   Eosinophils Relative 1  0 - 5 %   Eosinophils Absolute 0.1  0.0 - 0.7 K/uL   Basophils Relative 0  0 - 1 %   Basophils Absolute 0.0  0.0 - 0.1 K/uL  COMPREHENSIVE METABOLIC PANEL      Result Value Ref Range   Sodium 144  137 - 147 mEq/L   Potassium 3.8  3.7 - 5.3 mEq/L   Chloride 102  96 - 112 mEq/L   CO2 28  19 - 32 mEq/L   Glucose, Bld 169 (*) 70 - 99 mg/dL   BUN 19  6 - 23 mg/dL   Creatinine, Ser 0.80  0.50 - 1.10 mg/dL   Calcium 10.1  8.4 - 10.5 mg/dL   Total Protein 7.6  6.0 - 8.3 g/dL   Albumin 4.1  3.5 - 5.2 g/dL   AST 18  0 - 37 U/L   ALT 26  0 - 35 U/L   Alkaline Phosphatase 64  39 - 117 U/L   Total Bilirubin 0.3  0.3 - 1.2 mg/dL   GFR calc non Af Amer 81 (*) >90 mL/min   GFR calc Af Amer >90  >90 mL/min   Laboratory interpretation all normal except UA pending    Imaging Review Ct Abdomen Pelvis Wo Contrast  02/02/2014   CLINICAL DATA:  Right flank pain.  EXAM: CT ABDOMEN AND PELVIS WITHOUT CONTRAST  TECHNIQUE: Multidetector CT imaging of the abdomen and pelvis was performed following the standard protocol without intravenous contrast.  COMPARISON:  04/25/2009  FINDINGS: The lung bases appear clear.  No pleural or pericardial effusion.  No focal liver abnormalities identified. The gallbladder appears normal. There is no biliary dilatation. Normal appearance of the pancreas. The spleen is on unremarkable.  Normal appearance of the right adrenal gland. There is a left adrenal nodule measuring 2 point 3 cm, image number 28/series 2. This is unchanged from previous exam and likely represents a benign adenoma. The left kidney appears normal. There is right-sided hydronephrosis and perinephric fat stranding. Right hydroureter is identified. Within the distal right ureter there is a 4 mm stone measuring 4 mm. Previous hysterectomy.  Calcified atherosclerotic disease is noted  involving the abdominal aorta. No aneurysm. There is no upper abdominal adenopathy identified. No pelvic or inguinal adenopathy. The stomach appears stress set the patient has a small hiatal hernia. The stomach is otherwise normal. The small bowel loops have a normal course and caliber without obstruction. Normal appearance of the appendix. The colon is on unremarkable.  Review of the visualized osseous structures is significant for mild lumbar spondylosis.  IMPRESSION: 1. Right hydronephrosis and hydroureter. There is a stone in the distal ureter measuring 4 mm. 2. Stable left adrenal adenoma.   Electronically Signed   By: Kerby Moors M.D.   On: 02/02/2014 13:13    EKG Interpretation   None       MDM   Final diagnoses:  Ureteral stone with hydronephrosis  Right ureteral stone    New Prescriptions   ONDANSETRON (ZOFRAN ODT) 4 MG DISINTEGRATING TABLET    Take 1 tablet (4 mg total) by mouth every 8 (eight) hours as needed for nausea or vomiting.   OXYCODONE-ACETAMINOPHEN (PERCOCET/ROXICET) 5-325 MG PER TABLET    Take 1 or 2 po Q 6hrs for pain   TAMSULOSIN (FLOMAX) 0.4 MG CAPS CAPSULE    Take 1 po daily until  you pass the stone    Plan discharge   Rolland Porter, MD, Alanson Aly, MD 02/02/14 [phone]

## 2014-02-02 NOTE — ED Notes (Signed)
Patient walked to restroom was able to urinate- did not get specimen

## 2015-08-11 ENCOUNTER — Other Ambulatory Visit: Payer: Self-pay | Admitting: Family Medicine

## 2015-08-11 ENCOUNTER — Ambulatory Visit
Admission: RE | Admit: 2015-08-11 | Discharge: 2015-08-11 | Disposition: A | Discharge status: Home / Self Care | Payer: 59 | Source: Ambulatory Visit | Attending: Family Medicine | Admitting: Family Medicine

## 2015-08-11 DIAGNOSIS — M7989 Other specified soft tissue disorders: Secondary | ICD-10-CM

## 2015-09-07 IMAGING — CT CT ABD-PELV W/O CM
1 series · 15 of 27 positions shown, 19 images · non-contrast
Comparison: 04/25/2009

CLINICAL DATA: Right flank pain.

EXAM:
CT ABDOMEN AND PELVIS WITHOUT CONTRAST
TECHNIQUE: Multidetector CT imaging of the abdomen and pelvis was performed
following the standard protocol without intravenous contrast.

[Series 4: lung · axial · 0.74mm/px · z∈[-169,-49]mm · 15 of 27 slices shown, 19 images]
[im 2/27  soft-tissue]
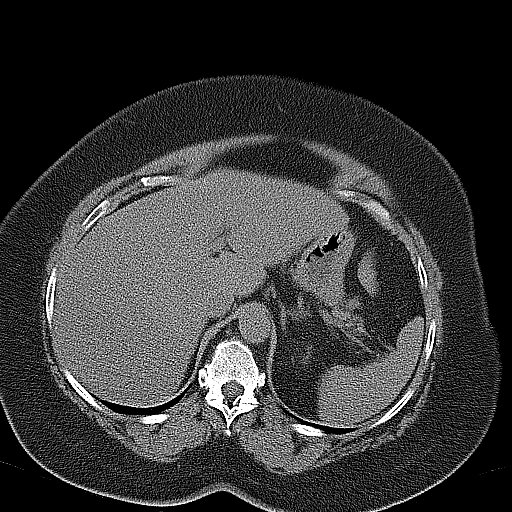
[im 2/27  bone]
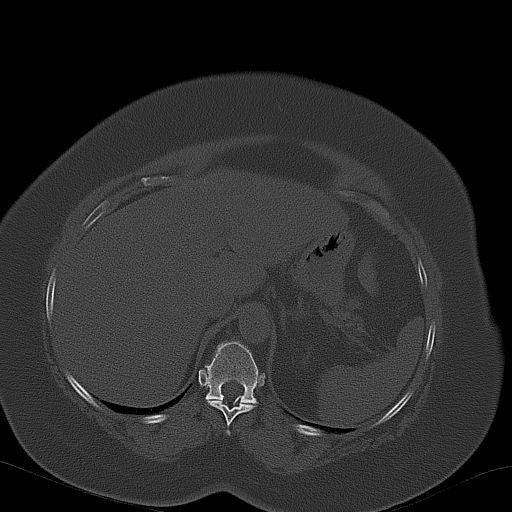
[im 4/27  soft-tissue]
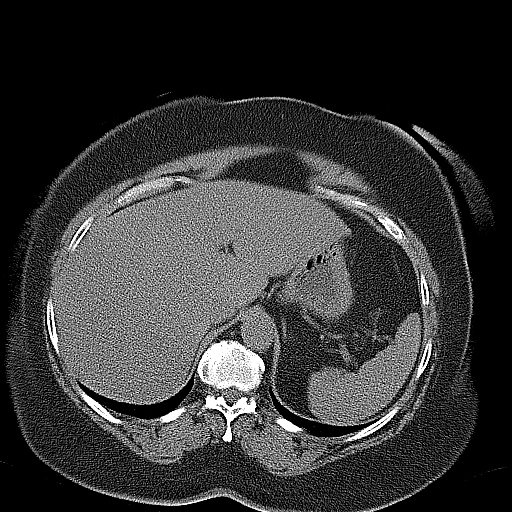
[im 6/27  soft-tissue]
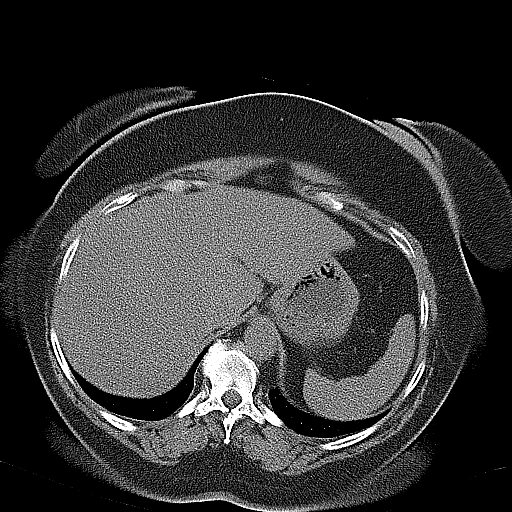
[im 8/27  soft-tissue]
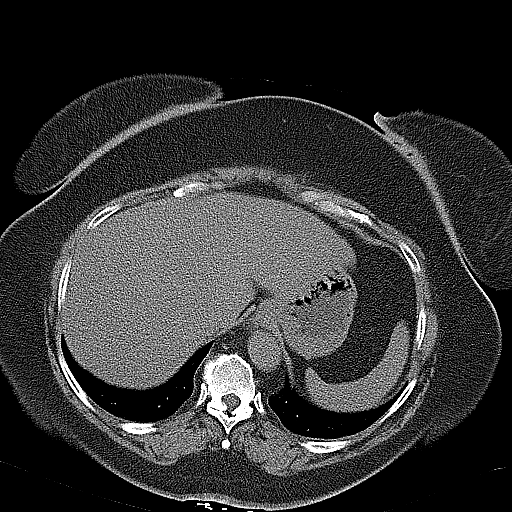
[im 10/27  soft-tissue]
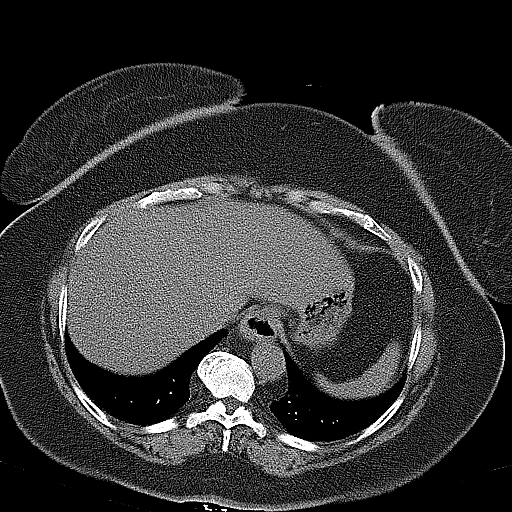
[im 12/27  soft-tissue]
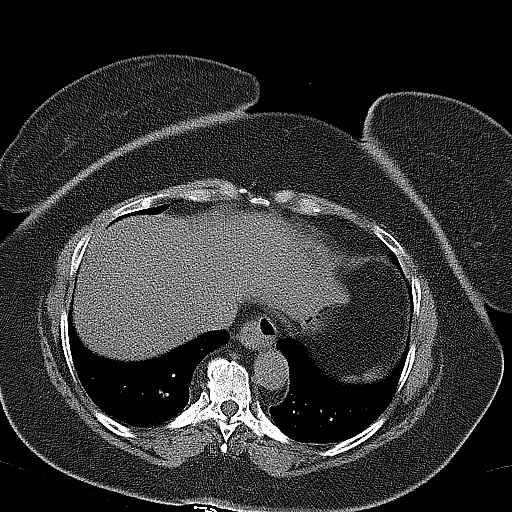
[im 14/27  soft-tissue]
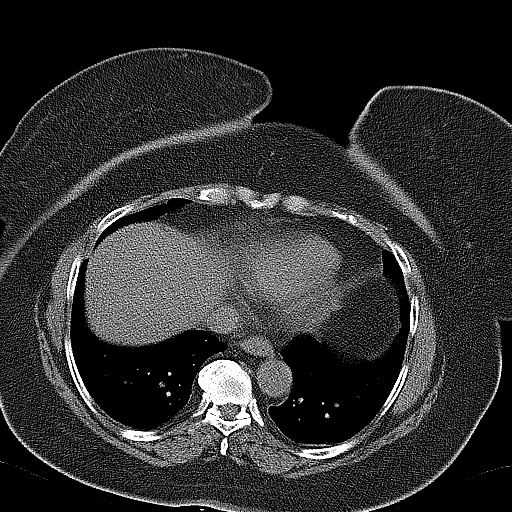
[im 16/27  soft-tissue]
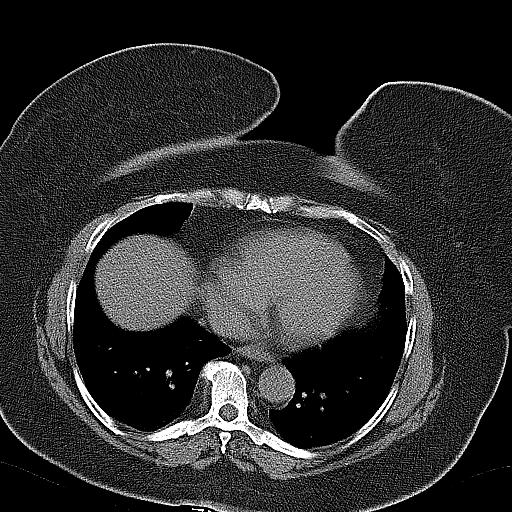
[im 18/27  soft-tissue]
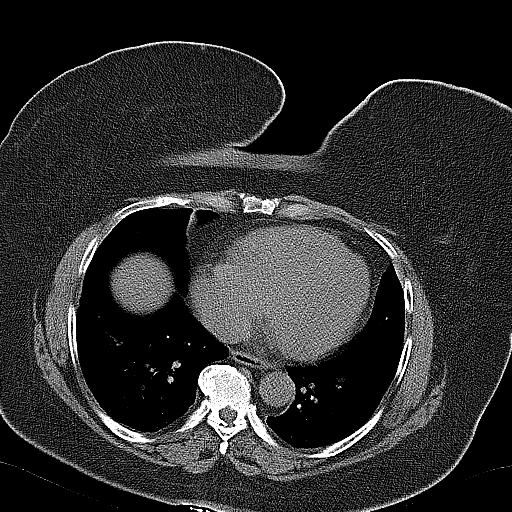
[im 18/27  bone]
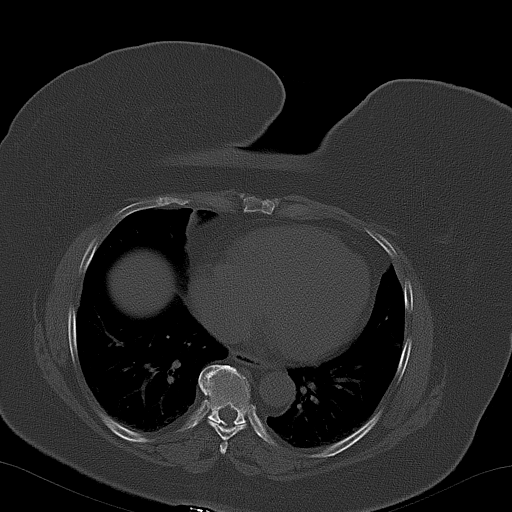
[im 20/27  soft-tissue]
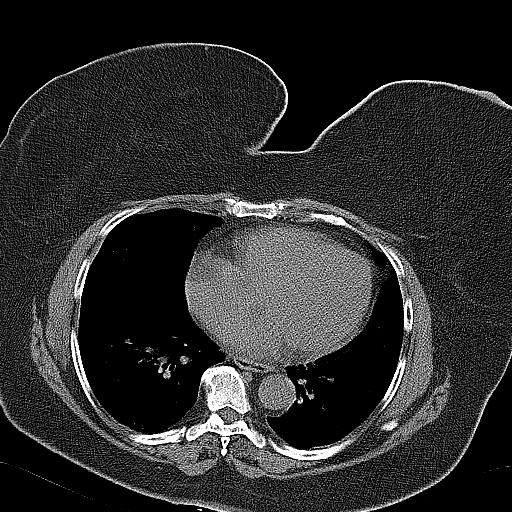
[im 22/27  soft-tissue]
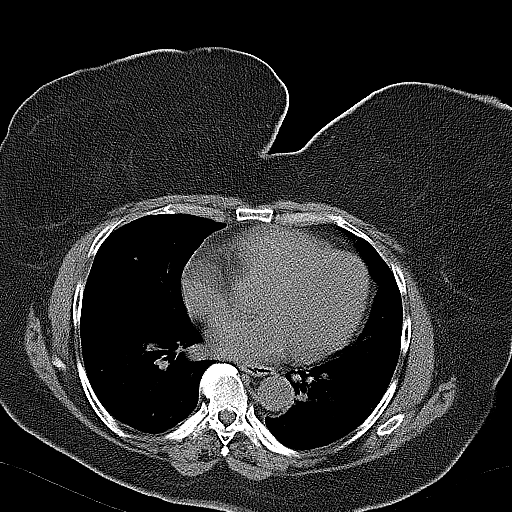
[im 23/27  lung]
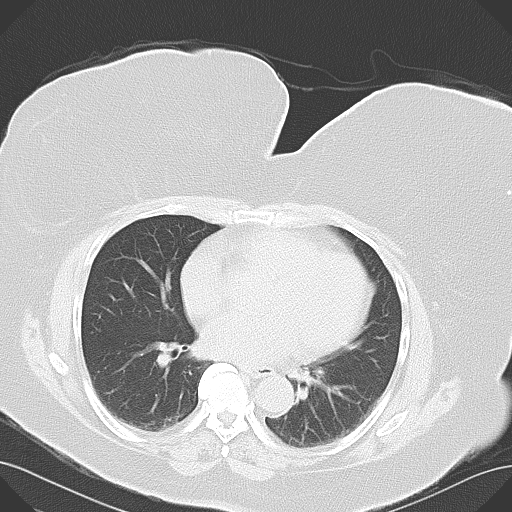
[im 24/27  soft-tissue]
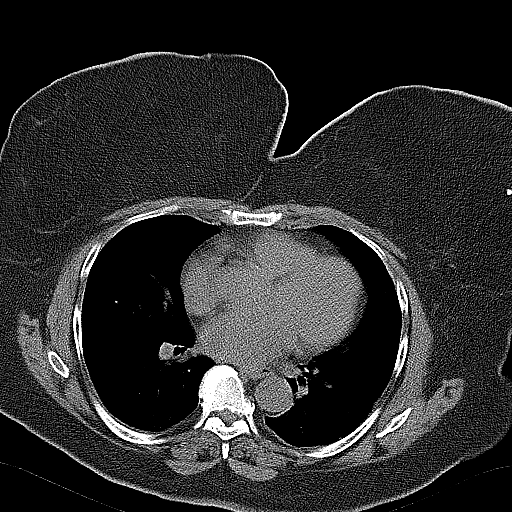
[im 24/27  lung]
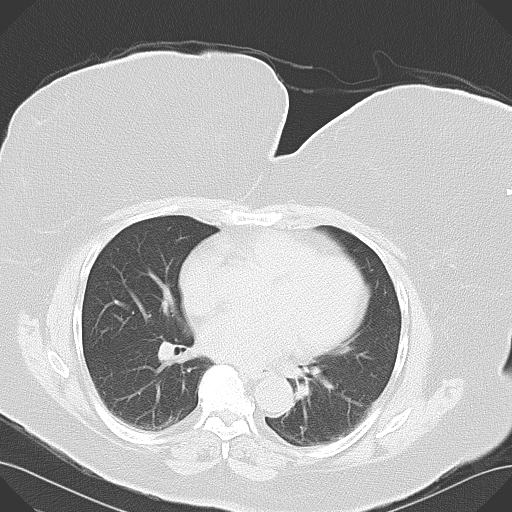
[im 25/27  lung]
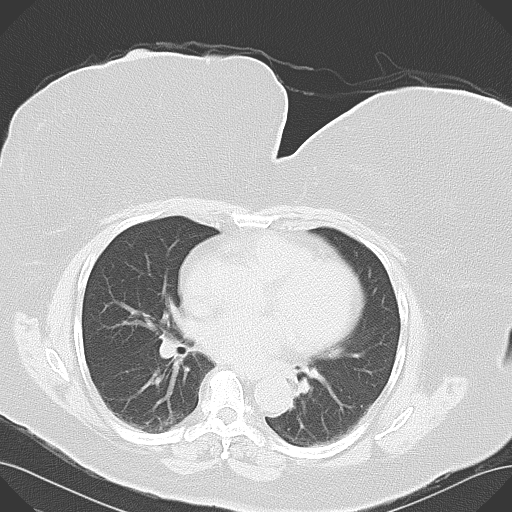
[im 26/27  soft-tissue]
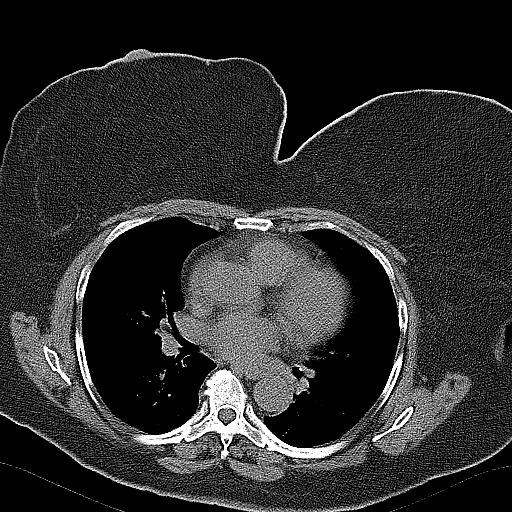
[im 26/27  lung]
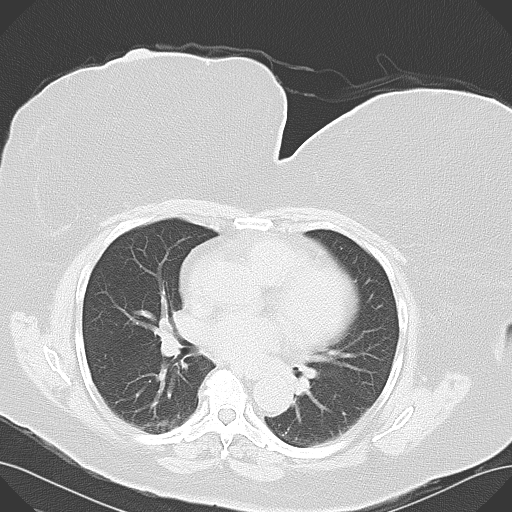

[15 of 27 positions shown; findings below may reference images not displayed]

FINDINGS: The lung bases appear clear.  No pleural or pericardial effusion.

No focal liver abnormalities identified. The gallbladder appears
normal. There is no biliary dilatation. Normal appearance of the
pancreas. The spleen is on unremarkable.

Normal appearance of the right adrenal gland. There is a left
adrenal nodule measuring 2 point 3 cm, image number 28/series 2.
This is unchanged from previous exam and likely represents a benign
adenoma. The left kidney appears normal. There is right-sided
hydronephrosis and perinephric fat stranding. Right hydroureter is
identified. Within the distal right ureter there is a 4 mm stone
measuring 4 mm. Previous hysterectomy.

Calcified atherosclerotic disease is noted involving the abdominal
aorta. No aneurysm. There is no upper abdominal adenopathy
identified. No pelvic or inguinal adenopathy. The stomach appears
stress set the patient has a small hiatal hernia. The stomach is
otherwise normal. The small bowel loops have a normal course and
caliber without obstruction. Normal appearance of the appendix. The
colon is on unremarkable.

Review of the visualized osseous structures is significant for mild
lumbar spondylosis.
IMPRESSION: 1. Right hydronephrosis and hydroureter. There is a stone in the
distal ureter measuring 4 mm.
2. Stable left adrenal adenoma.

## 2017-03-15 IMAGING — US US EXTREM LOW VENOUS*L*
1 series · 13 of 24 positions shown · non-contrast
Comparison: None.

CLINICAL DATA: Left lower extremity pain and edema. Visual color
changes of the extremity. Patient on anti coagulation.



[Series 1: us extrem low venous*left* · 13 of 31 slices shown]
[im 1/31]
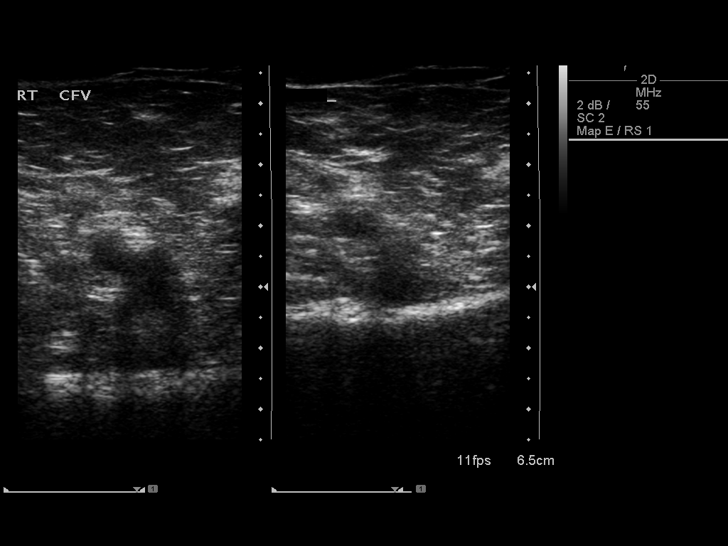
[im 3/31]
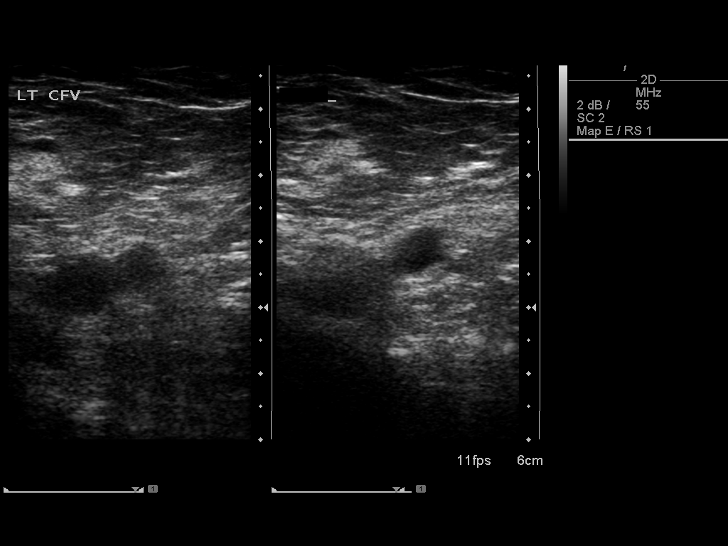
[im 6/31]
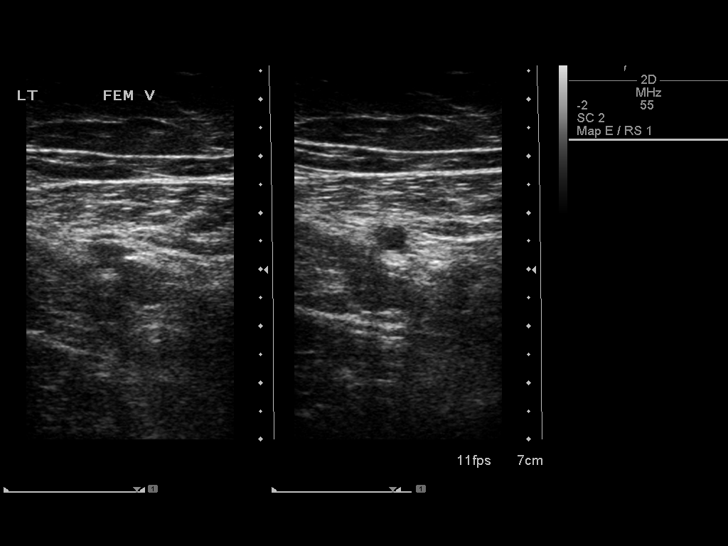
[im 8/31]
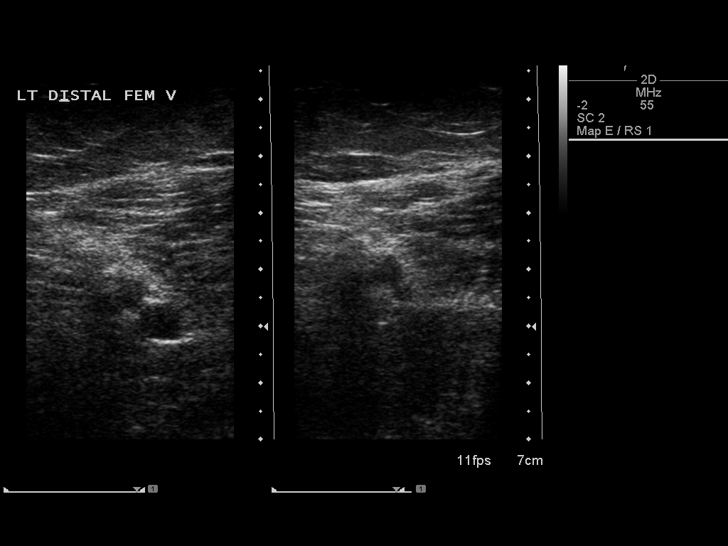
[im 11/31]
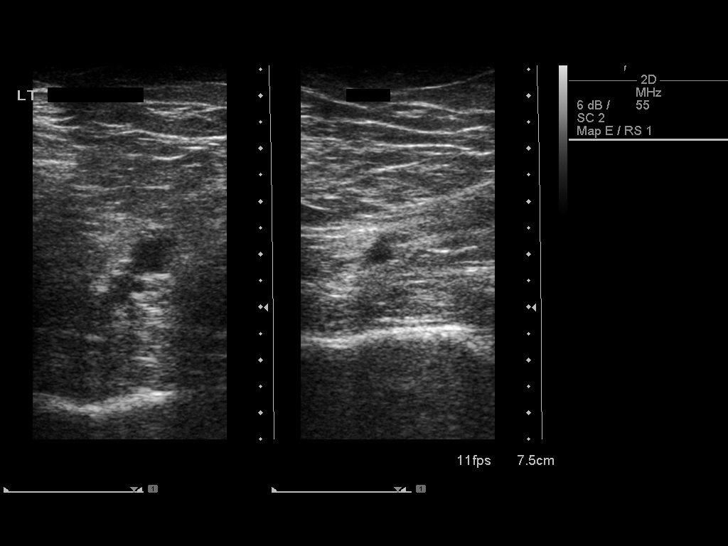
[im 14/31]
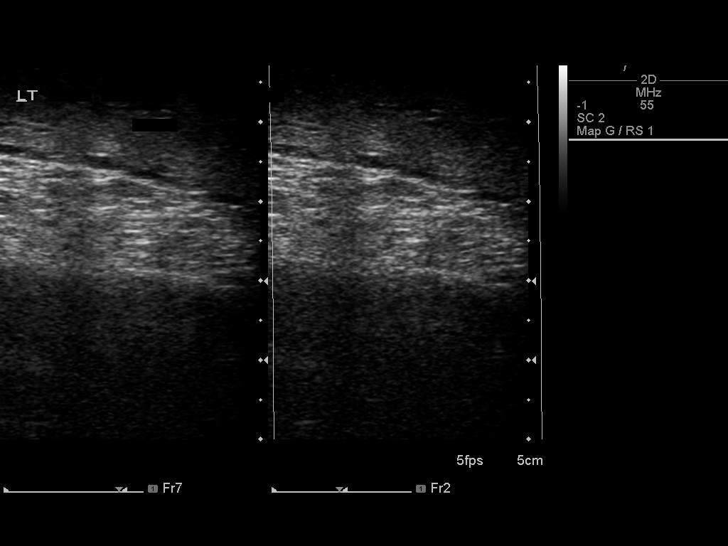
[im 16/31]
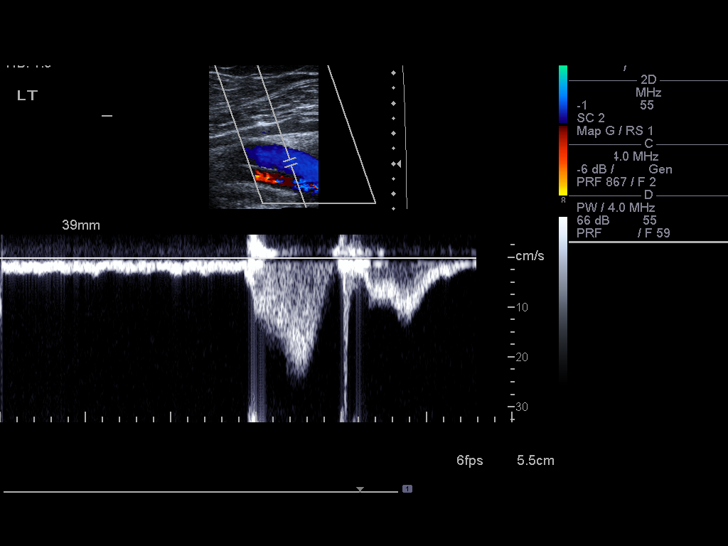
[im 17/31]
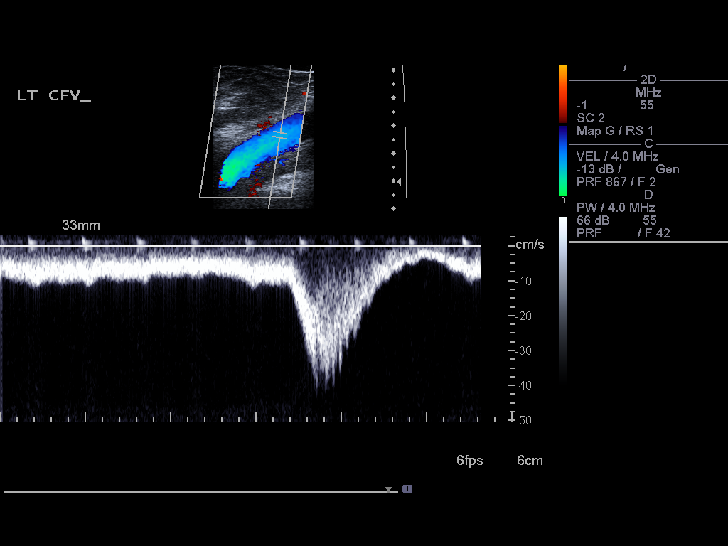
[im 20/31]
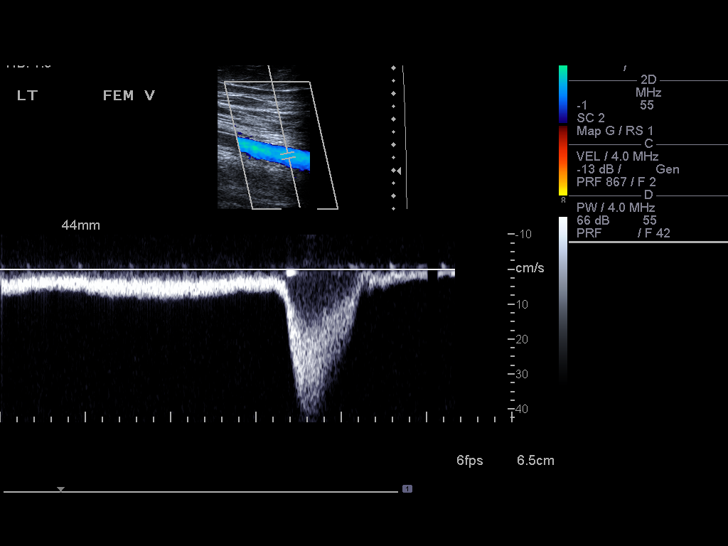
[im 23/31]
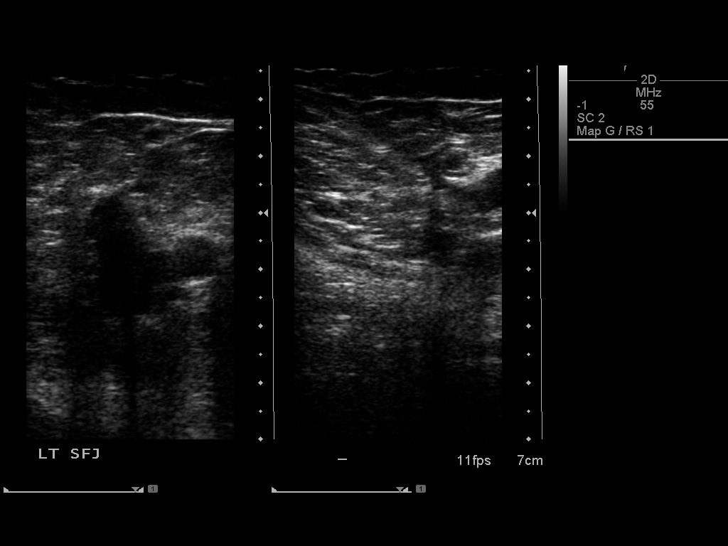
[im 25/31]
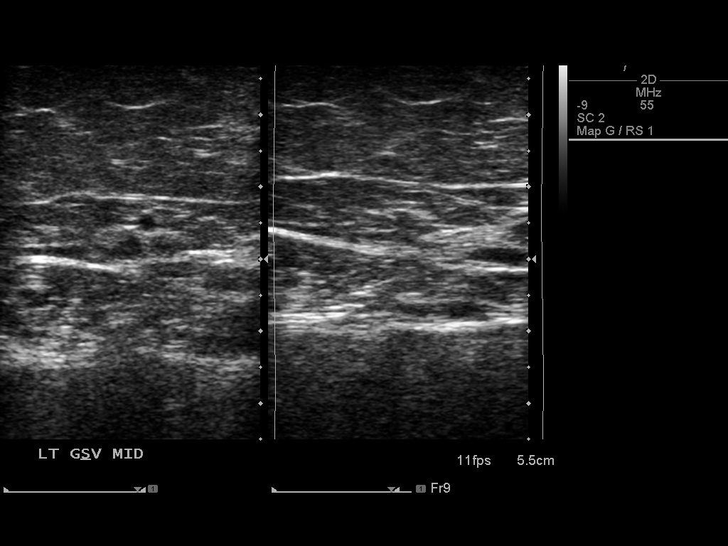
[im 28/31]
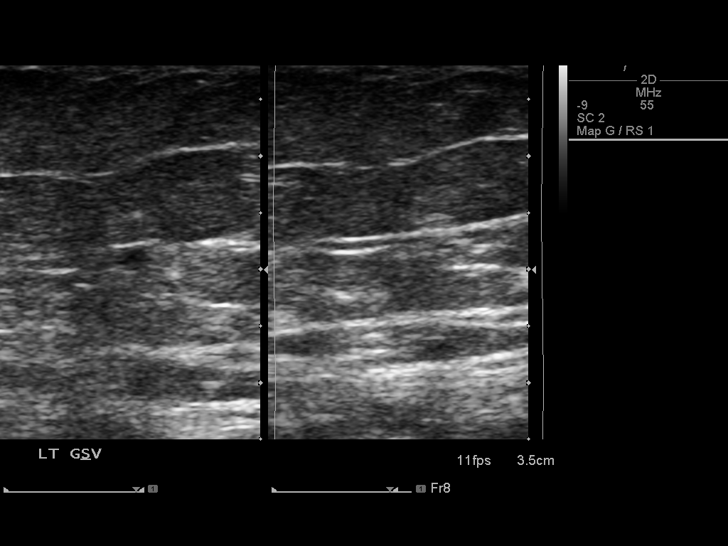
[im 31/31]
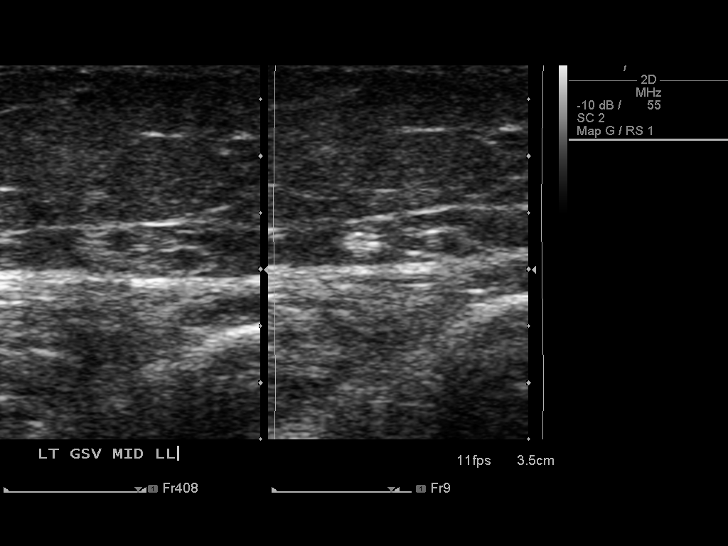

[13 of 24 positions shown; findings below may reference images not displayed]

FINDINGS: Contralateral Common Femoral Vein: Respiratory phasicity is normal
and symmetric with the symptomatic side. No evidence of thrombus.
Normal compressibility.

Common Femoral Vein: No evidence of thrombus. Normal
compressibility, respiratory phasicity and response to augmentation.

Saphenofemoral Junction: No evidence of thrombus. Normal
compressibility and flow on color Doppler imaging.

Profunda Femoral Vein: No evidence of thrombus. Normal
compressibility and flow on color Doppler imaging.

Femoral Vein: No evidence of thrombus. Normal compressibility,
respiratory phasicity and response to augmentation.

Popliteal Vein: No evidence of thrombus. Normal compressibility,
respiratory phasicity and response to augmentation.

Calf Veins: Visualized posterior tibial vein demonstrates no
evidence of thrombus with normal compressibility and flow on color
Doppler imaging. Peroneal veins are not visualize.

Venous Reflux:  None.

Other Findings:  None.
IMPRESSION: No evidence of deep venous thrombosis.

## 2018-06-06 ENCOUNTER — Other Ambulatory Visit: Payer: Self-pay | Admitting: Radiology

## 2019-06-05 ENCOUNTER — Other Ambulatory Visit: Payer: Self-pay | Admitting: *Deleted

## 2019-06-05 DIAGNOSIS — Z20822 Contact with and (suspected) exposure to covid-19: Secondary | ICD-10-CM

## 2019-06-08 LAB — NOVEL CORONAVIRUS, NAA: SARS-CoV-2, NAA: NOT DETECTED

## 2019-06-12 ENCOUNTER — Telehealth: Payer: Self-pay

## 2019-06-12 NOTE — Telephone Encounter (Signed)
Pt. Given COVID 19 results. Verbalizes understanding. 

## 2020-01-06 ENCOUNTER — Ambulatory Visit: Payer: BLUE CROSS/BLUE SHIELD | Attending: Internal Medicine

## 2020-01-06 DIAGNOSIS — Z23 Encounter for immunization: Secondary | ICD-10-CM

## 2020-01-07 NOTE — Progress Notes (Signed)
   Covid-19 Vaccination Clinic  Name:  [name]    MRN: [id number] DOB: [birth year]  01/06/2020  [name] was observed post Covid-19 immunization for 15 minutes without incidence. She was provided with Vaccine Information Sheet and instruction to access the V-Safe system.   [name] was instructed to call 911 with any severe reactions post vaccine: Marland Kitchen Difficulty breathing  . Swelling of your face and throat  . A fast heartbeat  . A bad rash all over your body  . Dizziness and weakness   Immunization History  Administered Date(s) Administered  . Moderna SARS-COVID-2 Vaccination 01/05/2019     Documented on behalf of:  B. Rorie

## 2020-02-03 ENCOUNTER — Ambulatory Visit: Payer: BLUE CROSS/BLUE SHIELD | Attending: Internal Medicine

## 2020-02-03 DIAGNOSIS — Z23 Encounter for immunization: Secondary | ICD-10-CM | POA: Insufficient documentation

## 2020-02-03 NOTE — Progress Notes (Signed)
   Covid-19 Vaccination Clinic  Name:  [name]    MRN: [id number] DOB: [birth year]  02/03/2020  [name] was observed post Covid-19 immunization for 15 minutes without incidence. She was provided with Vaccine Information Sheet and instruction to access the V-Safe system.   [name] was instructed to call 911 with any severe reactions post vaccine: Marland Kitchen Difficulty breathing  . Swelling of your face and throat  . A fast heartbeat  . A bad rash all over your body  . Dizziness and weakness    Immunizations Administered    Name Date Dose VIS Date Route   Moderna COVID-19 Vaccine 02/03/2020  1:25 PM 0.5 mL 11/13/2019 Intramuscular   Manufacturer: Moderna   Lot: [id number]   [id number]

## 2020-10-20 ENCOUNTER — Ambulatory Visit: Payer: BLUE CROSS/BLUE SHIELD

## 2022-07-23 ENCOUNTER — Encounter (HOSPITAL_COMMUNITY): Payer: Self-pay

## 2022-07-23 ENCOUNTER — Other Ambulatory Visit: Payer: Self-pay

## 2022-07-23 ENCOUNTER — Inpatient Hospital Stay (HOSPITAL_COMMUNITY)
Admission: EM | Admit: 2022-07-23 | Discharge: 2022-07-26 | DRG: 378 | Disposition: A | Discharge status: Home / Self Care | Payer: Medicare Other | Attending: Internal Medicine | Admitting: Internal Medicine

## 2022-07-23 DIAGNOSIS — Z90710 Acquired absence of both cervix and uterus: Secondary | ICD-10-CM

## 2022-07-23 DIAGNOSIS — E785 Hyperlipidemia, unspecified: Secondary | ICD-10-CM | POA: Diagnosis not present

## 2022-07-23 DIAGNOSIS — Z880 Allergy status to penicillin: Secondary | ICD-10-CM

## 2022-07-23 DIAGNOSIS — K625 Hemorrhage of anus and rectum: Principal | ICD-10-CM

## 2022-07-23 DIAGNOSIS — D72829 Elevated white blood cell count, unspecified: Secondary | ICD-10-CM | POA: Diagnosis present

## 2022-07-23 DIAGNOSIS — D62 Acute posthemorrhagic anemia: Secondary | ICD-10-CM | POA: Diagnosis present

## 2022-07-23 DIAGNOSIS — K589 Irritable bowel syndrome without diarrhea: Secondary | ICD-10-CM | POA: Diagnosis present

## 2022-07-23 DIAGNOSIS — R739 Hyperglycemia, unspecified: Secondary | ICD-10-CM

## 2022-07-23 DIAGNOSIS — E669 Obesity, unspecified: Secondary | ICD-10-CM | POA: Diagnosis present

## 2022-07-23 DIAGNOSIS — Z7982 Long term (current) use of aspirin: Secondary | ICD-10-CM

## 2022-07-23 DIAGNOSIS — D175 Benign lipomatous neoplasm of intra-abdominal organs: Secondary | ICD-10-CM | POA: Diagnosis present

## 2022-07-23 DIAGNOSIS — E870 Hyperosmolality and hypernatremia: Secondary | ICD-10-CM | POA: Diagnosis not present

## 2022-07-23 DIAGNOSIS — K5731 Diverticulosis of large intestine without perforation or abscess with bleeding: Principal | ICD-10-CM | POA: Diagnosis present

## 2022-07-23 DIAGNOSIS — I10 Essential (primary) hypertension: Secondary | ICD-10-CM | POA: Diagnosis not present

## 2022-07-23 DIAGNOSIS — K648 Other hemorrhoids: Secondary | ICD-10-CM | POA: Diagnosis present

## 2022-07-23 DIAGNOSIS — G20 Parkinson's disease: Secondary | ICD-10-CM | POA: Diagnosis not present

## 2022-07-23 DIAGNOSIS — Z7984 Long term (current) use of oral hypoglycemic drugs: Secondary | ICD-10-CM

## 2022-07-23 DIAGNOSIS — K922 Gastrointestinal hemorrhage, unspecified: Secondary | ICD-10-CM | POA: Diagnosis present

## 2022-07-23 DIAGNOSIS — K449 Diaphragmatic hernia without obstruction or gangrene: Secondary | ICD-10-CM | POA: Diagnosis present

## 2022-07-23 DIAGNOSIS — K259 Gastric ulcer, unspecified as acute or chronic, without hemorrhage or perforation: Secondary | ICD-10-CM | POA: Diagnosis present

## 2022-07-23 DIAGNOSIS — E876 Hypokalemia: Secondary | ICD-10-CM | POA: Diagnosis present

## 2022-07-23 DIAGNOSIS — E1165 Type 2 diabetes mellitus with hyperglycemia: Secondary | ICD-10-CM | POA: Diagnosis present

## 2022-07-23 DIAGNOSIS — Z8249 Family history of ischemic heart disease and other diseases of the circulatory system: Secondary | ICD-10-CM

## 2022-07-23 DIAGNOSIS — D123 Benign neoplasm of transverse colon: Secondary | ICD-10-CM | POA: Diagnosis present

## 2022-07-23 DIAGNOSIS — Z800 Family history of malignant neoplasm of digestive organs: Secondary | ICD-10-CM

## 2022-07-23 DIAGNOSIS — Z6836 Body mass index (BMI) 36.0-36.9, adult: Secondary | ICD-10-CM

## 2022-07-23 DIAGNOSIS — Z79899 Other long term (current) drug therapy: Secondary | ICD-10-CM

## 2022-07-23 DIAGNOSIS — Z791 Long term (current) use of non-steroidal anti-inflammatories (NSAID): Secondary | ICD-10-CM

## 2022-07-23 DIAGNOSIS — Z91018 Allergy to other foods: Secondary | ICD-10-CM

## 2022-07-23 LAB — CBC WITH DIFFERENTIAL/PLATELET
Abs Immature Granulocytes: 0.04 10*3/uL (ref 0.00–0.07)
Basophils Absolute: 0 10*3/uL (ref 0.0–0.1)
Basophils Relative: 0 %
Eosinophils Absolute: 0 10*3/uL (ref 0.0–0.5)
Eosinophils Relative: 0 %
HCT: 33.5 % — ABNORMAL LOW (ref 36.0–46.0)
Hemoglobin: 10.4 g/dL — ABNORMAL LOW (ref 12.0–15.0)
Immature Granulocytes: 0 %
Lymphocytes Relative: 17 %
Lymphs Abs: 1.9 10*3/uL (ref 0.7–4.0)
MCH: 28.8 pg (ref 26.0–34.0)
MCHC: 31 g/dL (ref 30.0–36.0)
MCV: 92.8 fL (ref 80.0–100.0)
Monocytes Absolute: 0.6 10*3/uL (ref 0.1–1.0)
Monocytes Relative: 5 %
Neutro Abs: 8.8 10*3/uL — ABNORMAL HIGH (ref 1.7–7.7)
Neutrophils Relative %: 78 %
Platelets: 244 10*3/uL (ref 150–400)
RBC: 3.61 MIL/uL — ABNORMAL LOW (ref 3.87–5.11)
RDW: 14.8 % (ref 11.5–15.5)
WBC: 11.4 10*3/uL — ABNORMAL HIGH (ref 4.0–10.5)
nRBC: 0 % (ref 0.0–0.2)

## 2022-07-23 LAB — TYPE AND SCREEN
ABO/RH(D): B POS
Antibody Screen: NEGATIVE

## 2022-07-23 LAB — COMPREHENSIVE METABOLIC PANEL
ALT: 18 U/L (ref 0–44)
AST: 15 U/L (ref 15–41)
Albumin: 3.7 g/dL (ref 3.5–5.0)
Alkaline Phosphatase: 51 U/L (ref 38–126)
Anion gap: 11 (ref 5–15)
BUN: 28 mg/dL — ABNORMAL HIGH (ref 8–23)
CO2: 26 mmol/L (ref 22–32)
Calcium: 9.8 mg/dL (ref 8.9–10.3)
Chloride: 105 mmol/L (ref 98–111)
Creatinine, Ser: 0.83 mg/dL (ref 0.44–1.00)
GFR, Estimated: >60 mL/min (ref >60)
Glucose, Bld: 423 mg/dL — ABNORMAL HIGH (ref 70–99)
Potassium: 3.7 mmol/L (ref 3.5–5.1)
Sodium: 142 mmol/L (ref 135–145)
Total Bilirubin: 0.6 mg/dL (ref 0.3–1.2)
Total Protein: 6.6 g/dL (ref 6.5–8.1)

## 2022-07-23 LAB — APTT: aPTT: 20 seconds — ABNORMAL LOW (ref 24–36)

## 2022-07-23 LAB — PROTIME-INR
INR: 1.1 (ref 0.8–1.2)
Prothrombin Time: 14 seconds (ref 11.4–15.2)

## 2022-07-23 LAB — POC OCCULT BLOOD, ED: Fecal Occult Bld: POSITIVE — AB

## 2022-07-23 MED ORDER — SODIUM CHLORIDE 0.9 % IV BOLUS
1000.0000 mL | Freq: Once | INTRAVENOUS | Status: AC
  Administered 2022-07-23: 1000 mL via INTRAVENOUS

## 2022-07-23 MED ORDER — PANTOPRAZOLE 80MG IVPB - SIMPLE MED
80.0000 mg | Freq: Once | INTRAVENOUS | Status: AC
  Administered 2022-07-23: 80 mg via INTRAVENOUS
  Filled 2022-07-23: qty 80

## 2022-07-23 MED ORDER — SIMVASTATIN 10 MG PO TABS
10.0000 mg | ORAL_TABLET | Freq: Every day | ORAL | Status: DC
  Administered 2022-07-24 – 2022-07-25 (×3): 10 mg via ORAL
  Filled 2022-07-23 (×3): qty 1

## 2022-07-23 MED ORDER — POTASSIUM CHLORIDE CRYS ER 10 MEQ PO TBCR: 10.0000 meq | EXTENDED_RELEASE_TABLET | Freq: Every day | ORAL | Status: DC

## 2022-07-23 MED ORDER — CARBIDOPA-LEVODOPA 25-100 MG PO TABS
1.0000 | ORAL_TABLET | Freq: Three times a day (TID) | ORAL | Status: DC
  Administered 2022-07-24 – 2022-07-26 (×8): 1 via ORAL
  Filled 2022-07-23 (×8): qty 1

## 2022-07-23 MED ORDER — INSULIN ASPART PROT & ASPART (70-30 MIX) 100 UNIT/ML ~~LOC~~ SUSP
8.0000 [IU] | Freq: Once | SUBCUTANEOUS | Status: AC
  Administered 2022-07-23: 8 [IU] via SUBCUTANEOUS
  Filled 2022-07-23: qty 10

## 2022-07-23 MED ORDER — HYDROCHLOROTHIAZIDE 12.5 MG PO TABS: 12.5000 mg | ORAL_TABLET | Freq: Every day | ORAL | Status: DC

## 2022-07-23 MED ORDER — LOSARTAN POTASSIUM 50 MG PO TABS
50.0000 mg | ORAL_TABLET | Freq: Every day | ORAL | Status: DC
  Administered 2022-07-24 – 2022-07-26 (×3): 50 mg via ORAL
  Filled 2022-07-23 (×3): qty 1

## 2022-07-23 MED ORDER — ESCITALOPRAM OXALATE 20 MG PO TABS
20.0000 mg | ORAL_TABLET | Freq: Every day | ORAL | Status: DC
  Administered 2022-07-24 – 2022-07-26 (×3): 20 mg via ORAL
  Filled 2022-07-23 (×3): qty 1

## 2022-07-23 MED ORDER — LOSARTAN POTASSIUM-HCTZ 50-12.5 MG PO TABS: 1.0000 | ORAL_TABLET | Freq: Every day | ORAL | Status: DC

## 2022-07-23 NOTE — H&P (Signed)
History and Physical    Patient: [name] RFF:[id number] DOB: Sep 05, 1956 DOA: 07/23/2022 DOS: the patient was seen and examined on 07/24/2022 PCP: Carol Ada, MD  Patient coming from: Home  Chief Complaint:  Chief Complaint  Patient presents with   Rectal Bleeding   HPI: [name] is a 66 y.o. female with medical history significant of Parkinson's disease, hypertension, type 2 diabetes who presents with rectal bleeding.  She had two episodes today of seeing maroon-colored stool when she wiped and also dark red blood in the toilet.  Denies any previous history of GI bleed.  No abdominal pain.  Reports having right upper thigh pain for the past month following a trip to [place] and has been taking Advil for the past 2 weeks.  Started on meloxicam last week.  Also is on aspirin daily.  Back in mid July she also received multiple doses of I steroid injection to her joints.  Denies any alcohol use. Had remote colonoscopy but unclear results.   In the ED, she was afebrile normotensive on room air.  Hemoglobin stable at 10.6 although this is down from 12.4 from March 2023.  FOBT is positive with maroon-colored stool noted. Leukocytosis of 11.4.  No significant electrolyte abnormalities.  Has hyperglycemia of 423 but no anion gap.  She was given IV 80 mg Protonix, 1 L of normal saline for and 8 units of 70/30 NovoLog in the ED. Review of Systems: As mentioned in the history of present illness. All other systems reviewed and are negative. Past Medical History:  Diagnosis Date   Diabetes mellitus    Hypertension    IBS (irritable bowel syndrome)    Obesity    Umbilical hernia    Past Surgical History:  Procedure Laterality Date   ABDOMINAL HYSTERECTOMY  APril j23, 2012   COLONOSCOPY     HERNIA REPAIR  10/11/11   umb hernia   LASER ABLATION OF THE CERVIX     WISDOM TOOTH EXTRACTION     Social History:  reports that she has never smoked. She has never used  smokeless tobacco. She reports that she does not drink alcohol and does not use drugs.  Allergies  Allergen Reactions   Amoxicillin Anaphylaxis   Spinach Other (See Comments)    Reaction: blistering of the lips    Family History  Problem Relation Age of Onset   Cancer Mother        colon   Heart disease Mother        heart attack    Prior to Admission medications   Medication Sig Start Date End Date Taking? Authorizing Provider  aspirin EC 81 MG tablet Take 81 mg by mouth at bedtime.   Yes [provider]  carbidopa-levodopa (SINEMET IR) 25-100 MG tablet Take 1 tablet by mouth 3 (three) times daily. 05/25/22  Yes [provider]  Cyanocobalamin (VITAMIN B-12 PO) Take 1 tablet by mouth daily.   Yes [provider]  escitalopram (LEXAPRO) 20 MG tablet Take 20 mg by mouth daily. 07/23/22  Yes [provider]  losartan-hydrochlorothiazide (HYZAAR) 50-12.5 MG tablet Take 1 tablet by mouth daily. 05/13/22  Yes [provider]  meloxicam (MOBIC) 15 MG tablet Take 15 mg by mouth daily.   Yes [provider]  metFORMIN (GLUCOPHAGE) 1000 MG tablet Take 1,000 mg by mouth daily with breakfast.   Yes [provider]  Multiple Vitamins-Minerals (MULTIVITAMIN WITH MINERALS) tablet Take 1 tablet by mouth daily.  Yes [provider]  potassium chloride (KLOR-CON M) 10 MEQ tablet Take 10 mEq by mouth daily. 06/01/22  Yes [provider]  simvastatin (ZOCOR) 10 MG tablet Take 10 mg by mouth at bedtime.   Yes [provider]  ondansetron (ZOFRAN ODT) 4 MG disintegrating tablet Take 1 tablet (4 mg total) by mouth every 8 (eight) hours as needed for nausea or vomiting. Patient not taking: Reported on 07/23/2022 02/02/14   Pattricia Boss, MD  oxyCODONE-acetaminophen (PERCOCET/ROXICET) 5-325 MG per tablet Take 1 or 2 po Q 6hrs for pain Patient not taking: Reported on 07/23/2022 02/02/14   Pattricia Boss, MD  tamsulosin [facility])  0.4 MG CAPS capsule Take 1 po daily until you pass the stone Patient not taking: Reported on 07/23/2022 02/02/14   Pattricia Boss, MD    Physical Exam: Vitals:   07/23/22 2345 07/24/22 0105 07/24/22 0128 07/24/22 0135  BP:  (!) 158/91  (!) 167/85  Pulse: 89 93  88  Resp:    17  Temp:   97.7 F (36.5 C)   TempSrc:   Oral   SpO2: 97% 99%  99%  Height:      Constitutional: NAD, calm, comfortable, elderly female sitting upright on edge of the bed Eyes: PERRL, lids and conjunctivae normal ENMT: Mucous membranes are moist.  Neck: normal, supple Respiratory: clear to auscultation bilaterally, no wheezing, no crackles. Normal respiratory effort. No accessory muscle use.  Cardiovascular: Regular rate and rhythm, no murmurs / rubs / gallops. No extremity edema. Abdomen: Soft, nontender nondistended bowel sounds positive.  Rectal exam deferred. Musculoskeletal: no clubbing / cyanosis. No joint deformity upper and lower extremities. Good ROM, no contractures. Normal muscle tone.  Skin: no rashes, lesions, ulcers.  Neurologic: CN 2-12 grossly intact. Strength 5/5 in all 4.  Delayed and slow speech. Psychiatric: Normal judgment and insight. Alert and oriented x 3. Normal mood. Data Reviewed:  See HPI  Assessment and Plan: * Acute GI bleeding Rectal bleed with recent increase NSAIDS use -Hgb stable at 10.6 down up 12.4 (02/2022) -Epic message sent to [facility] to evaluate in the morning -keep NPO  Parkinson disease ([facility]) Continue home carbidopa-levodopa  HLD (hyperlipidemia) Continue statin  Type 2 diabetes mellitus with hyperglycemia (HCC) CBG of 423 on presentation. -8 units of Novolog 70/30 given in ED -start sensitive SSI   HTN (hypertension) Continue Losartan-HCTZ      Advance Care Planning:   Code Status: Full Code   Consults: [facility]  Family Communication: Discussed with son and husband at bedside  Severity of Illness: The appropriate patient status for this patient  is OBSERVATION. Observation status is judged to be reasonable and necessary in order to provide the required intensity of service to ensure the patient's safety. The patient's presenting symptoms, physical exam findings, and initial radiographic and laboratory data in the context of their medical condition is felt to place them at decreased risk for further clinical deterioration. Furthermore, it is anticipated that the patient will be medically stable for discharge from the hospital within 2 midnights of admission.   Author: Orene Desanctis, DO 07/24/2022 1:53 AM  For on call review [email or web address].

## 2022-07-23 NOTE — ED Provider Triage Note (Signed)
Emergency Medicine Provider Triage Evaluation Note  NAJWA SPILLANE , a 66 y.o. female  was evaluated in triage.  Pt complains of bleeding.  Patient states that she was on the toilet earlier and urinated and had a bowel movement and noticed "a lot of blood" in the toilet after.  She is unaware of where exactly it came from but did notice its presence.  Denies pain with bowel movement.  Denies blood thinner use.  Denies fever, abdominal pain, nausea, vomiting.  She reports history of hysterectomy.  Review of Systems  Positive: See above Negative:   Physical Exam  BP (!) 149/77 (BP Location: Right Arm)   Pulse 93   Temp 98.4 F (36.9 C) (Oral)   Resp 18   Ht '5\' 6"'$  (1.676 m)   SpO2 100%  Gen:   Awake, no distress   Resp:  Normal effort  MSK:   Moves extremities without difficulty  Other:  No abdominal tenderness.  No CVA tenderness bilaterally  Medical Decision Making  Medically screening exam initiated at 7:53 PM.  Appropriate orders placed.  Jonna Munro was informed that the remainder of the evaluation will be completed by another provider, this initial triage assessment does not replace that evaluation, and the importance of remaining in the ED until their evaluation is complete.     Wilnette Kales, [place] 07/23/22 1955

## 2022-07-23 NOTE — ED Triage Notes (Signed)
Pt reports with rectal bleeding since today. Pt states that she went to the bathroom, wiped, and saw bright red blood. Pt reports having hemorrhoids in the past.

## 2022-07-23 NOTE — Assessment & Plan Note (Signed)
Continue Losartan-HCTZ

## 2022-07-23 NOTE — Assessment & Plan Note (Signed)
Continue home carbidopa-levodopa

## 2022-07-23 NOTE — Assessment & Plan Note (Signed)
Continue statin. 

## 2022-07-23 NOTE — ED Provider Notes (Addendum)
[facility] DEPT Provider Note   CSN: [id number] Arrival date & time: 07/23/22  1828     History  Chief Complaint  Patient presents with   Rectal Bleeding    [name] is a 66 y.o. female.  Pt c/o dark blood per rectum today. Denies hx same. States had large bm in toilet, and then wiped and was also dark maroon. Denies known hx diverticulitis, pud or significant gi bleeding. No anticoag use. Denies recent nsaid use. Remote hx colonoscopy but does not know what it showed then. No abd pain. No vomiting. Felt generally weak today. No syncope.  The history is provided by the patient, a relative and medical records.  Rectal Bleeding Associated symptoms: no abdominal pain, no fever and no vomiting        Home Medications Prior to Admission medications   Medication Sig Start Date End Date Taking? Authorizing Provider  aspirin EC 81 MG tablet Take 81 mg by mouth at bedtime.    [provider]  Biotin 1000 MCG tablet Take 1,000 mcg by mouth every morning.    [provider]  estradiol (VIVELLE-DOT) 0.075 MG/24HR Place 1 patch onto the skin 2 (two) times a week.    [provider]  JANUMET 50-1000 MG per tablet Take 1 tablet by mouth 2 (two) times daily with a meal.  08/20/11   [provider]  ondansetron (ZOFRAN ODT) 4 MG disintegrating tablet Take 1 tablet (4 mg total) by mouth every 8 (eight) hours as needed for nausea or vomiting. 02/02/14   Pattricia Boss, MD  oxyCODONE-acetaminophen (PERCOCET/ROXICET) 5-325 MG per tablet Take 1 or 2 po Q 6hrs for pain 02/02/14   Pattricia Boss, MD  simvastatin (ZOCOR) 10 MG tablet Take 10 mg by mouth at bedtime.    [provider]  tamsulosin (FLOMAX) 0.4 MG CAPS capsule Take 1 po daily until you pass the stone 02/02/14   Pattricia Boss, MD  TRIBENZOR 40-5-25 MG TABS Take 1 tablet by mouth every morning.  01/14/14   [provider]      Allergies    Amoxicillin  and Spinach    Review of Systems   Review of Systems  Constitutional:  Negative for chills and fever.  HENT:  Negative for sore throat.   Eyes:  Negative for redness.  Respiratory:  Negative for shortness of breath.   Cardiovascular:  Negative for chest pain.  Gastrointestinal:  Positive for blood in stool and hematochezia. Negative for abdominal pain and vomiting.  Genitourinary:  Negative for flank pain and hematuria.  Musculoskeletal:  Negative for back pain.  Skin:  Negative for rash.  Neurological:  Negative for headaches.  Hematological:  Does not bruise/bleed easily.  Psychiatric/Behavioral:  Negative for confusion.     Physical Exam Updated Vital Signs BP (!) 149/77 (BP Location: Right Arm)   Pulse 93   Temp 98.4 F (36.9 C) (Oral)   Resp 18   Ht 1.676 m ('5\' 6"'$ )   SpO2 100%  Physical Exam Vitals and nursing note reviewed.  Constitutional:      Appearance: Normal appearance. She is well-developed.  HENT:     Head: Atraumatic.     Nose: Nose normal.     Mouth/Throat:     Mouth: Mucous membranes are moist.  Eyes:     General: No scleral icterus.    Conjunctiva/sclera: Conjunctivae normal.  Neck:     Trachea: No tracheal deviation.  Cardiovascular:  Rate and Rhythm: Normal rate and regular rhythm.     Pulses: Normal pulses.     Heart sounds: Normal heart sounds. No murmur heard.    No friction rub. No gallop.  Pulmonary:     Effort: Pulmonary effort is normal. No respiratory distress.     Breath sounds: Normal breath sounds.  Abdominal:     General: Bowel sounds are normal. There is no distension.     Palpations: Abdomen is soft. There is no mass.     Tenderness: There is no abdominal tenderness. There is no guarding.  Genitourinary:    Comments: No cva tenderness. Dark maroon stool, heme positive.  Musculoskeletal:        General: No swelling or tenderness.     Cervical back: Normal range of motion and neck supple. No rigidity. No muscular tenderness.   Skin:    General: Skin is warm and dry.     Findings: No rash.  Neurological:     Mental Status: She is alert.     Comments: Alert, speech normal.   Psychiatric:        Mood and Affect: Mood normal.     ED Results / Procedures / Treatments   Labs (all labs ordered are listed, but only abnormal results are displayed) Results for orders placed or performed during the hospital encounter of 07/23/22  Comprehensive metabolic panel  Result Value Ref Range   Sodium 142 135 - 145 mmol/L   Potassium 3.7 3.5 - 5.1 mmol/L   Chloride 105 98 - 111 mmol/L   CO2 26 22 - 32 mmol/L   Glucose, Bld 423 (H) 70 - 99 mg/dL   BUN 28 (H) 8 - 23 mg/dL   Creatinine, Ser 0.83 0.44 - 1.00 mg/dL   Calcium 9.8 8.9 - 10.3 mg/dL   Total Protein 6.6 6.5 - 8.1 g/dL   Albumin 3.7 3.5 - 5.0 g/dL   AST 15 15 - 41 U/L   ALT 18 0 - 44 U/L   Alkaline Phosphatase 51 38 - 126 U/L   Total Bilirubin 0.6 0.3 - 1.2 mg/dL   GFR, Estimated >60 >60 mL/min   Anion gap 11 5 - 15  CBC with Differential  Result Value Ref Range   WBC 11.4 (H) 4.0 - 10.5 K/uL   RBC 3.61 (L) 3.87 - 5.11 MIL/uL   Hemoglobin 10.4 (L) 12.0 - 15.0 g/dL   HCT 33.5 (L) 36.0 - 46.0 %   MCV 92.8 80.0 - 100.0 fL   MCH 28.8 26.0 - 34.0 pg   MCHC 31.0 30.0 - 36.0 g/dL   RDW 14.8 11.5 - 15.5 %   Platelets 244 150 - 400 K/uL   nRBC 0.0 0.0 - 0.2 %   Neutrophils Relative % 78 %   Neutro Abs 8.8 (H) 1.7 - 7.7 K/uL   Lymphocytes Relative 17 %   Lymphs Abs 1.9 0.7 - 4.0 K/uL   Monocytes Relative 5 %   Monocytes Absolute 0.6 0.1 - 1.0 K/uL   Eosinophils Relative 0 %   Eosinophils Absolute 0.0 0.0 - 0.5 K/uL   Basophils Relative 0 %   Basophils Absolute 0.0 0.0 - 0.1 K/uL   Immature Granulocytes 0 %   Abs Immature Granulocytes 0.04 0.00 - 0.07 K/uL  Protime-INR  Result Value Ref Range   Prothrombin Time 14.0 11.4 - 15.2 seconds   INR 1.1 0.8 - 1.2  APTT  Result Value Ref Range   aPTT 20 (L) 24 -  36 seconds  POC occult blood, ED  Result  Value Ref Range   Fecal Occult Bld POSITIVE (A) NEGATIVE     EKG None  Radiology No results found.  Procedures Procedures    Medications Ordered in ED Medications  pantoprazole (PROTONIX) 80 mg /NS 100 mL IVPB (has no administration in time range)    ED Course/ Medical Decision Making/ A&P                           Medical Decision Making Problems Addressed: Acute GI bleeding: acute illness or injury with systemic symptoms that poses a threat to life or bodily functions Hyperglycemia: acute illness or injury with systemic symptoms that poses a threat to life or bodily functions Rectal bleeding: acute illness or injury with systemic symptoms that poses a threat to life or bodily functions  Amount and/or Complexity of Data Reviewed Independent Historian:     Details: family, hx External Data Reviewed: labs and notes. Labs: ordered. Decision-making details documented in ED Course. Discussion of management or test interpretation with external provider(s): hospitalists  Risk Prescription drug management. Decision regarding hospitalization.   Iv ns. Continuous pulse ox and cardiac monitoring. Labs ordered/sent. Imaging ordered.   Reviewed nursing notes and prior charts for additional history. External reports reviewed. Additional history from: family.   Cardiac monitor: sinus rhythm, rate 94.  Labs reviewed/interpreted by me - hgb 10, decreased from prior. Stools heme positive. Glucose high. Hco3 normal. Ns bolus. Novolog sq.  Protonix iv. Ns bolus.   Given very dark maroon stools, drop in hgb compared to prior, will admit.   Hospitalists consulted for admission. Discussed pt - will admit.            Final Clinical Impression(s) / ED Diagnoses Final diagnoses:  None    Rx / DC Orders ED Discharge Orders     None          Lajean Saver, MD 07/23/22 2235

## 2022-07-24 ENCOUNTER — Inpatient Hospital Stay (HOSPITAL_COMMUNITY): Payer: Medicare Other

## 2022-07-24 DIAGNOSIS — Z91018 Allergy to other foods: Secondary | ICD-10-CM | POA: Diagnosis not present

## 2022-07-24 DIAGNOSIS — G20 Parkinson's disease: Secondary | ICD-10-CM | POA: Diagnosis present

## 2022-07-24 DIAGNOSIS — E785 Hyperlipidemia, unspecified: Secondary | ICD-10-CM | POA: Diagnosis present

## 2022-07-24 DIAGNOSIS — D123 Benign neoplasm of transverse colon: Secondary | ICD-10-CM | POA: Diagnosis present

## 2022-07-24 DIAGNOSIS — Z791 Long term (current) use of non-steroidal anti-inflammatories (NSAID): Secondary | ICD-10-CM | POA: Diagnosis not present

## 2022-07-24 DIAGNOSIS — E1165 Type 2 diabetes mellitus with hyperglycemia: Secondary | ICD-10-CM | POA: Diagnosis present

## 2022-07-24 DIAGNOSIS — E669 Obesity, unspecified: Secondary | ICD-10-CM | POA: Diagnosis present

## 2022-07-24 DIAGNOSIS — K259 Gastric ulcer, unspecified as acute or chronic, without hemorrhage or perforation: Secondary | ICD-10-CM | POA: Diagnosis present

## 2022-07-24 DIAGNOSIS — K449 Diaphragmatic hernia without obstruction or gangrene: Secondary | ICD-10-CM | POA: Diagnosis present

## 2022-07-24 DIAGNOSIS — E870 Hyperosmolality and hypernatremia: Secondary | ICD-10-CM | POA: Diagnosis not present

## 2022-07-24 DIAGNOSIS — Z90710 Acquired absence of both cervix and uterus: Secondary | ICD-10-CM | POA: Diagnosis not present

## 2022-07-24 DIAGNOSIS — D175 Benign lipomatous neoplasm of intra-abdominal organs: Secondary | ICD-10-CM | POA: Diagnosis present

## 2022-07-24 DIAGNOSIS — K589 Irritable bowel syndrome without diarrhea: Secondary | ICD-10-CM | POA: Diagnosis present

## 2022-07-24 DIAGNOSIS — D62 Acute posthemorrhagic anemia: Secondary | ICD-10-CM | POA: Diagnosis present

## 2022-07-24 DIAGNOSIS — K222 Esophageal obstruction: Secondary | ICD-10-CM | POA: Diagnosis not present

## 2022-07-24 DIAGNOSIS — R52 Pain, unspecified: Secondary | ICD-10-CM | POA: Diagnosis not present

## 2022-07-24 DIAGNOSIS — Z6836 Body mass index (BMI) 36.0-36.9, adult: Secondary | ICD-10-CM | POA: Diagnosis not present

## 2022-07-24 DIAGNOSIS — Z7982 Long term (current) use of aspirin: Secondary | ICD-10-CM | POA: Diagnosis not present

## 2022-07-24 DIAGNOSIS — K648 Other hemorrhoids: Secondary | ICD-10-CM | POA: Diagnosis present

## 2022-07-24 DIAGNOSIS — Z880 Allergy status to penicillin: Secondary | ICD-10-CM | POA: Diagnosis not present

## 2022-07-24 DIAGNOSIS — K625 Hemorrhage of anus and rectum: Secondary | ICD-10-CM | POA: Diagnosis present

## 2022-07-24 DIAGNOSIS — D72829 Elevated white blood cell count, unspecified: Secondary | ICD-10-CM | POA: Diagnosis present

## 2022-07-24 DIAGNOSIS — K635 Polyp of colon: Secondary | ICD-10-CM | POA: Diagnosis not present

## 2022-07-24 DIAGNOSIS — K5731 Diverticulosis of large intestine without perforation or abscess with bleeding: Secondary | ICD-10-CM | POA: Diagnosis present

## 2022-07-24 DIAGNOSIS — Z8249 Family history of ischemic heart disease and other diseases of the circulatory system: Secondary | ICD-10-CM | POA: Diagnosis not present

## 2022-07-24 DIAGNOSIS — E876 Hypokalemia: Secondary | ICD-10-CM | POA: Diagnosis present

## 2022-07-24 DIAGNOSIS — I10 Essential (primary) hypertension: Secondary | ICD-10-CM | POA: Diagnosis present

## 2022-07-24 DIAGNOSIS — K922 Gastrointestinal hemorrhage, unspecified: Secondary | ICD-10-CM | POA: Diagnosis not present

## 2022-07-24 DIAGNOSIS — K5730 Diverticulosis of large intestine without perforation or abscess without bleeding: Secondary | ICD-10-CM | POA: Diagnosis not present

## 2022-07-24 DIAGNOSIS — Z800 Family history of malignant neoplasm of digestive organs: Secondary | ICD-10-CM | POA: Diagnosis not present

## 2022-07-24 LAB — CBC
HCT: 29.3 % — ABNORMAL LOW (ref 36.0–46.0)
Hemoglobin: 8.9 g/dL — ABNORMAL LOW (ref 12.0–15.0)
MCH: 28.5 pg (ref 26.0–34.0)
MCHC: 30.4 g/dL (ref 30.0–36.0)
MCV: 93.9 fL (ref 80.0–100.0)
Platelets: 191 10*3/uL (ref 150–400)
RBC: 3.12 MIL/uL — ABNORMAL LOW (ref 3.87–5.11)
RDW: 15.1 % (ref 11.5–15.5)
WBC: 7.9 10*3/uL (ref 4.0–10.5)
nRBC: 0 % (ref 0.0–0.2)

## 2022-07-24 LAB — URINALYSIS, ROUTINE W REFLEX MICROSCOPIC
Bilirubin Urine: NEGATIVE
Glucose, UA: >=500 mg/dL — AB
Ketones, ur: NEGATIVE mg/dL
Leukocytes,Ua: NEGATIVE
Nitrite: NEGATIVE
Protein, ur: NEGATIVE mg/dL
Specific Gravity, Urine: 1.027 (ref 1.005–1.030)
pH: 5 (ref 5.0–8.0)

## 2022-07-24 LAB — GLUCOSE, CAPILLARY
Glucose-Capillary: 182 mg/dL — ABNORMAL HIGH (ref 70–99)
Glucose-Capillary: 169 mg/dL — ABNORMAL HIGH (ref 70–99)
Glucose-Capillary: 171 mg/dL — ABNORMAL HIGH (ref 70–99)
Glucose-Capillary: 138 mg/dL — ABNORMAL HIGH (ref 70–99)
Glucose-Capillary: 128 mg/dL — ABNORMAL HIGH (ref 70–99)

## 2022-07-24 LAB — BASIC METABOLIC PANEL
Anion gap: 5 (ref 5–15)
BUN: 28 mg/dL — ABNORMAL HIGH (ref 8–23)
CO2: 26 mmol/L (ref 22–32)
Calcium: 9.7 mg/dL (ref 8.9–10.3)
Chloride: 115 mmol/L — ABNORMAL HIGH (ref 98–111)
Creatinine, Ser: 0.74 mg/dL (ref 0.44–1.00)
GFR, Estimated: >60 mL/min (ref >60)
Glucose, Bld: 288 mg/dL — ABNORMAL HIGH (ref 70–99)
Potassium: 3.4 mmol/L — ABNORMAL LOW (ref 3.5–5.1)
Sodium: 146 mmol/L — ABNORMAL HIGH (ref 135–145)

## 2022-07-24 LAB — HEMOGLOBIN AND HEMATOCRIT, BLOOD
HCT: 28.4 % — ABNORMAL LOW (ref 36.0–46.0)
HCT: 30.9 % — ABNORMAL LOW (ref 36.0–46.0)
Hemoglobin: 8.9 g/dL — ABNORMAL LOW (ref 12.0–15.0)
Hemoglobin: 9.6 g/dL — ABNORMAL LOW (ref 12.0–15.0)

## 2022-07-24 LAB — HEMOGLOBIN A1C
Hgb A1c MFr Bld: 8.3 % — ABNORMAL HIGH (ref 4.8–5.6)
Mean Plasma Glucose: 191.51 mg/dL

## 2022-07-24 LAB — CBG MONITORING, ED: Glucose-Capillary: 337 mg/dL — ABNORMAL HIGH (ref 70–99)

## 2022-07-24 MED ORDER — LABETALOL HCL 5 MG/ML IV SOLN
10.0000 mg | Freq: Once | INTRAVENOUS | Status: AC
  Administered 2022-07-24: 10 mg via INTRAVENOUS
  Filled 2022-07-24: qty 4

## 2022-07-24 MED ORDER — ONDANSETRON HCL 4 MG/2ML IJ SOLN: 4.0000 mg | Freq: Four times a day (QID) | INTRAMUSCULAR | Status: DC | PRN

## 2022-07-24 MED ORDER — PANTOPRAZOLE SODIUM 40 MG IV SOLR: 40.0000 mg | Freq: Two times a day (BID) | INTRAVENOUS | Status: DC

## 2022-07-24 MED ORDER — PANTOPRAZOLE 80MG IVPB - SIMPLE MED
80.0000 mg | Freq: Once | INTRAVENOUS | Status: AC
  Administered 2022-07-24: 80 mg via INTRAVENOUS
  Filled 2022-07-24: qty 80

## 2022-07-24 MED ORDER — HYDRALAZINE HCL 20 MG/ML IJ SOLN: 10.0000 mg | Freq: Four times a day (QID) | INTRAMUSCULAR | Status: DC | PRN

## 2022-07-24 MED ORDER — PEG 3350-KCL-NA BICARB-NACL 420 G PO SOLR
4000.0000 mL | Freq: Once | ORAL | Status: AC
  Administered 2022-07-24: 4000 mL via ORAL

## 2022-07-24 MED ORDER — POTASSIUM CHLORIDE 10 MEQ/100ML IV SOLN
10.0000 meq | INTRAVENOUS | Status: AC
  Administered 2022-07-24 (×3): 10 meq via INTRAVENOUS
  Filled 2022-07-24 (×3): qty 100

## 2022-07-24 MED ORDER — PANTOPRAZOLE INFUSION (NEW) - SIMPLE MED
8.0000 mg/h | INTRAVENOUS | Status: DC
  Administered 2022-07-24 (×2): 8 mg/h via INTRAVENOUS
  Filled 2022-07-24: qty 100
  Filled 2022-07-24 (×2): qty 80

## 2022-07-24 MED ORDER — SODIUM CHLORIDE 0.9 % IV SOLN: INTRAVENOUS | Status: DC | PRN

## 2022-07-24 MED ORDER — INSULIN GLARGINE-YFGN 100 UNIT/ML ~~LOC~~ SOLN
15.0000 [IU] | Freq: Every day | SUBCUTANEOUS | Status: DC
  Administered 2022-07-24 – 2022-07-25 (×2): 15 [IU] via SUBCUTANEOUS
  Filled 2022-07-24 (×3): qty 0.15

## 2022-07-24 MED ORDER — SODIUM CHLORIDE 0.45 % IV SOLN: INTRAVENOUS | Status: DC

## 2022-07-24 MED ORDER — INSULIN ASPART 100 UNIT/ML IJ SOLN
0.0000 [IU] | Freq: Three times a day (TID) | INTRAMUSCULAR | Status: DC
  Administered 2022-07-24 (×2): 2 [IU] via SUBCUTANEOUS
  Administered 2022-07-24: 1 [IU] via SUBCUTANEOUS
  Administered 2022-07-24 – 2022-07-25 (×2): 2 [IU] via SUBCUTANEOUS
  Administered 2022-07-25: 3 [IU] via SUBCUTANEOUS
  Administered 2022-07-25: 5 [IU] via SUBCUTANEOUS
  Administered 2022-07-26: 2 [IU] via SUBCUTANEOUS
  Administered 2022-07-26: 5 [IU] via SUBCUTANEOUS
  Filled 2022-07-24: qty 0.09

## 2022-07-24 NOTE — Progress Notes (Signed)
PROGRESS NOTE    [name]  ZWC:[id number] DOB: Jul 04, 1956 DOA: 07/23/2022 PCP: Carol Ada, MD   Brief Narrative: 66 year old with past medical history significant for Parkinson disease, hypertension, diabetes type 2 presented with rectal bleeding.  She has been taking Advil and meloxicam on for right lower extremity pain.  She presented with a hemoglobin of 10 down from 21 February 2022. Patient has been admitted for GI bleed, started on Protonix.  [facility] consulted.    Assessment & Plan:   Principal Problem:   Acute GI bleeding Active Problems:   HTN (hypertension)   Type 2 diabetes mellitus with hyperglycemia (HCC)   HLD (hyperlipidemia)   Parkinson disease (HCC)   1-Acute GI bleed: Patient presented with dark red blood per rectum. Hemoglobin on admission at 10, previously 12. Started  Protonix drip. Continue to monitor hemoglobin, transfuse if active bleeding or hemoglobin continues to decrease. Continue n.p.o. status.  [facility] consulted.  Monitor Hb stable 8.9   2-Parkinson disease: Continue with carbidopa levodopa  3-Hyperlipidemia: Continue with Zocor  Diabetes type 2 with hyperglycemia: Uncontrolled A1c 8.3 Continue with a sliding scale insulin We will add Semglee.  Hypertension: Hold hydrochlorothiazide due to hyponatremia.  Continue with Cozaar. As needed hydralazine added.  Mild hypernatremia: Suspect related to dehydration. Start V fluid 0.4 normal saline percent  Hypokalemia: Replete IV.   Right LE pain; Check doppler.   Estimated body mass index is 36.22 kg/m as calculated from the following:   Height as of this encounter: '5\' 6"'$  (1.676 m).   Weight as of this encounter: 101.8 kg.   DVT prophylaxis: SCD Code Status: Full code Family Communication: Care discussed with patient.  Disposition Plan:  Status is: Observation The patient will require care spanning > 2 midnights and should be moved to inpatient because: management of  GI bleed.     Consultants:  GI  Procedures:  None  Antimicrobials:    Subjective: She report Bloody stool at 2 am.  Patient had BM maroon stool later in the morning.  Report right leg pain   Objective: Vitals:   07/24/22 0216 07/24/22 0242 07/24/22 0321 07/24/22 0552  BP: (!) 183/85 (!) 183/85 (!) 169/73 (!) 171/78  Pulse: 77 77 76 72  Resp: '18 18  17  '$ Temp: 98.3 F (36.8 C) 98.3 F (36.8 C)  98.1 F (36.7 C)  TempSrc: Oral Oral  Oral  SpO2: 100%   100%  Weight:  101.8 kg    Height: '5\' 6"'$  (1.676 m) '5\' 6"'$  (1.676 m)      Intake/Output Summary (Last 24 hours) at 07/24/2022 2353 Last data filed at 07/24/2022 0200 Gross per 24 hour  Intake 1100 ml  Output --  Net 1100 ml   Filed Weights   07/24/22 0242  Weight: 101.8 kg    Examination:  General exam: Appears calm and comfortable  Respiratory system: Clear to auscultation. Respiratory effort normal. Cardiovascular system: S1 & S2 heard, RRR. No JVD, murmurs, rubs, gallops or clicks. No pedal edema. Gastrointestinal system: Abdomen is nondistended, soft and nontender. No organomegaly or masses felt. Normal bowel sounds heard. Central nervous system: Alert and oriented. No focal neurological deficits. Extremities: Symmetric 5 x 5 power. Skin: No rashes, lesions or ulcers   Data Reviewed: I have personally reviewed following labs and imaging studies  CBC: Recent Labs  Lab 07/23/22 2010 07/24/22 0342  WBC 11.4* 7.9  NEUTROABS 8.8*  --   HGB 10.4* 8.9*  HCT 33.5* 29.3*  MCV 92.8 93.9  PLT 244 071   Basic Metabolic Panel: Recent Labs  Lab 07/23/22 2010 07/24/22 0342  NA 142 146*  K 3.7 3.4*  CL 105 115*  CO2 26 26  GLUCOSE 423* 288*  BUN 28* 28*  CREATININE 0.83 0.74  CALCIUM 9.8 9.7   GFR: Estimated Creatinine Clearance: 84.4 mL/min (by C-G formula based on SCr of 0.74 mg/dL). Liver Function Tests: Recent Labs  Lab 07/23/22 2010  AST 15  ALT 18  ALKPHOS 51  BILITOT 0.6  PROT 6.6   ALBUMIN 3.7   No results for input(s): "LIPASE", "AMYLASE" in the last 168 hours. No results for input(s): "AMMONIA" in the last 168 hours. Coagulation Profile: Recent Labs  Lab 07/23/22 2010  INR 1.1   Cardiac Enzymes: No results for input(s): "CKTOTAL", "CKMB", "CKMBINDEX", "TROPONINI" in the last 168 hours. BNP (last 3 results) No results for input(s): "PROBNP" in the last 8760 hours. HbA1C: Recent Labs    07/24/22 0116  HGBA1C 8.3*   CBG: Recent Labs  Lab 07/24/22 0124  GLUCAP 337*   Lipid Profile: No results for input(s): "CHOL", "HDL", "LDLCALC", "TRIG", "CHOLHDL", "LDLDIRECT" in the last 72 hours. Thyroid Function Tests: No results for input(s): "TSH", "T4TOTAL", "FREET4", "T3FREE", "THYROIDAB" in the last 72 hours. Anemia Panel: No results for input(s): "VITAMINB12", "FOLATE", "FERRITIN", "TIBC", "IRON", "RETICCTPCT" in the last 72 hours. Sepsis Labs: No results for input(s): "PROCALCITON", "LATICACIDVEN" in the last 168 hours.  No results found for this or any previous visit (from the past 240 hour(s)).       Radiology Studies: No results found.      Scheduled Meds:  carbidopa-levodopa  1 tablet Oral TID   escitalopram  20 mg Oral Daily   losartan  50 mg Oral Daily   And   hydrochlorothiazide  12.5 mg Oral Daily   insulin aspart  0-9 Units Subcutaneous TID PC & HS   potassium chloride  10 mEq Oral Daily   simvastatin  10 mg Oral QHS   Continuous Infusions:   LOS: 0 days    Time spent: 35 minutes.     Elmarie Shiley, MD [facility]   If 7PM-7AM, please contact night-coverage [email or web address]  07/24/2022, 6:37 AM

## 2022-07-24 NOTE — Assessment & Plan Note (Signed)
Rectal bleed with recent increase NSAIDS use -Hgb stable at 10.6 down up 12.4 (02/2022) -Epic message sent to [facility] to evaluate in the morning -keep NPO

## 2022-07-24 NOTE — H&P (View-Only) (Signed)
Referring Provider:  [place] Primary Care Physician:  Carol Ada, MD Primary Gastroenterologist: Dr. Collene Mares  Reason for Consultation: GI bleeding  HPI: [name] is a 66 y.o. female with past medical history of hypertension, diabetes, Parkinson's disease presented to the hospital with rectal bleeding.  Was found to have drop in hemoglobin to 10.4 from baseline 12.  Normal INR, normal LFTs.  Occult blood positive.  Repeat blood work this morning showed hemoglobin down to 8.9.  GI is consulted for further evaluation.  Patient seen and examined at bedside.  Family at bedside.  Patient started noticing bright red blood per rectum yesterday.  Denied any associated abdominal pain, nausea or vomiting.  No previous history of GI bleed.  Subsequently started noticing blood clots along with the bright blood per rectum.  Last bowel movement this morning.  Patient admits using significant NSAIDs.  She takes meloxicam on a daily basis along with as needed Aleve as well as baby aspirin.  Family history of colon cancer in mother in her 62s.  Last colonoscopy several years ago with Dr. Collene Mares.  Report not available to review.   Past Medical History:  Diagnosis Date   Diabetes mellitus    Hypertension    IBS (irritable bowel syndrome)    Obesity    Umbilical hernia     Past Surgical History:  Procedure Laterality Date   ABDOMINAL HYSTERECTOMY  APril j23, 2012   COLONOSCOPY     HERNIA REPAIR  10/11/11   umb hernia   LASER ABLATION OF THE CERVIX     WISDOM TOOTH EXTRACTION      Prior to Admission medications   Medication Sig Start Date End Date Taking? Authorizing Provider  aspirin EC 81 MG tablet Take 81 mg by mouth at bedtime.   Yes [provider]  carbidopa-levodopa (SINEMET IR) 25-100 MG tablet Take 1 tablet by mouth 3 (three) times daily. 05/25/22  Yes [provider]  Cyanocobalamin (VITAMIN B-12 PO) Take 1 tablet by mouth daily.   Yes [provider]   escitalopram (LEXAPRO) 20 MG tablet Take 20 mg by mouth daily. 07/23/22  Yes [provider]  losartan-hydrochlorothiazide (HYZAAR) 50-12.5 MG tablet Take 1 tablet by mouth daily. 05/13/22  Yes [provider]  meloxicam (MOBIC) 15 MG tablet Take 15 mg by mouth daily.   Yes [provider]  metFORMIN (GLUCOPHAGE) 1000 MG tablet Take 1,000 mg by mouth daily with breakfast.   Yes [provider]  Multiple Vitamins-Minerals (MULTIVITAMIN WITH MINERALS) tablet Take 1 tablet by mouth daily.   Yes [provider]  potassium chloride (KLOR-CON M) 10 MEQ tablet Take 10 mEq by mouth daily. 06/01/22  Yes [provider]  simvastatin (ZOCOR) 10 MG tablet Take 10 mg by mouth at bedtime.   Yes [provider]  ondansetron (ZOFRAN ODT) 4 MG disintegrating tablet Take 1 tablet (4 mg total) by mouth every 8 (eight) hours as needed for nausea or vomiting. Patient not taking: Reported on 07/23/2022 02/02/14   Pattricia Boss, MD  oxyCODONE-acetaminophen (PERCOCET/ROXICET) 5-325 MG per tablet Take 1 or 2 po Q 6hrs for pain Patient not taking: Reported on 07/23/2022 02/02/14   Pattricia Boss, MD  tamsulosin [facility]) 0.4 MG CAPS capsule Take 1 po daily until you pass the stone Patient not taking: Reported on 07/23/2022 02/02/14   Pattricia Boss, MD    Scheduled Meds:  carbidopa-levodopa  1 tablet Oral TID   escitalopram  20 mg Oral Daily  insulin aspart  0-9 Units Subcutaneous TID PC & HS   insulin glargine-yfgn  15 Units Subcutaneous QHS   losartan  50 mg Oral Daily   [START ON 07/27/2022] pantoprazole  40 mg Intravenous Q12H   simvastatin  10 mg Oral QHS   Continuous Infusions:  sodium chloride 100 mL/hr at 07/24/22 0801   sodium chloride 10 mL/hr at 07/24/22 0802   pantoprazole Stopped (07/24/22 1110)   potassium chloride 10 mEq (07/24/22 1110)   PRN Meds:.sodium chloride, hydrALAZINE  Allergies as of 07/23/2022 - Review Complete 07/23/2022  Allergen  Reaction Noted   Amoxicillin Anaphylaxis 09/22/2011   Spinach Other (See Comments) 02/02/2014    Family History  Problem Relation Age of Onset   Cancer Mother        colon   Heart disease Mother        heart attack    Social History   Socioeconomic History   Marital status: Married    Spouse name: Not on file   Number of children: Not on file   Years of education: Not on file   Highest education level: Not on file  Occupational History   Not on file  Tobacco Use   Smoking status: Never   Smokeless tobacco: Never  Substance and Sexual Activity   Alcohol use: No   Drug use: No   Sexual activity: Not on file  Other Topics Concern   Not on file  Social History Narrative   Not on file   Social Determinants of Health   Financial Resource Strain: Not on file  Food Insecurity: Not on file  Transportation Needs: Not on file  Physical Activity: Not on file  Stress: Not on file  Social Connections: Not on file  Intimate Partner Violence: Not on file    Review of Systems: All negative except as stated above in HPI.  Physical Exam: Vital signs: Vitals:   07/24/22 0800 07/24/22 1008  BP: (!) 185/68 (!) 156/93  Pulse:  65  Resp: 20 16  Temp:  98.2 F (36.8 C)  SpO2: 100% 98%   Last BM Date : 07/24/22 General:   Alert,  Well-developed, well-nourished, pleasant and cooperative in NAD Lungs:  no visible respiratory distress, anterior exam only Heart:  Regular rate and rhythm; no murmurs, clicks, rubs,  or gallops. Abdomen: Soft, nontender, nondistended, bowel sounds present, no peritoneal signs Psych -mood and affect normal Neuro -alert and oriented x3 Rectal:  Deferred  GI:  Lab Results: Recent Labs    07/23/22 2010 07/24/22 0342  WBC 11.4* 7.9  HGB 10.4* 8.9*  HCT 33.5* 29.3*  PLT 244 191   BMET Recent Labs    07/23/22 2010 07/24/22 0342  NA 142 146*  K 3.7 3.4*  CL 105 115*  CO2 26 26  GLUCOSE 423* 288*  BUN 28* 28*  CREATININE 0.83 0.74   CALCIUM 9.8 9.7   LFT Recent Labs    07/23/22 2010  PROT 6.6  ALBUMIN 3.7  AST 15  ALT 18  ALKPHOS 51  BILITOT 0.6   PT/INR Recent Labs    07/23/22 2010  LABPROT 14.0  INR 1.1     Studies/Results: No results found.  Impression/Plan: -Painless hematochezia.  Likely diverticular bleed in setting of significant NSAID use.  Also need to rule out upper GI bleed because of significant NSAID use. -Significant NSAID use -Family history of colon cancer in mother -Acute blood loss anemia  Recommendations ------------------------- -Plan for EGD and colonoscopy tomorrow -  Continue PPI -ok to have clear liquid diet today.  Keep n.p.o. past midnight.  Risks (bleeding, infection, bowel perforation that could require surgery, sedation-related changes in cardiopulmonary systems), benefits (identification and possible treatment of source of symptoms, exclusion of certain causes of symptoms), and alternatives (watchful waiting, radiographic imaging studies, empiric medical treatment)  were explained to patient/family in detail and patient wishes to proceed.      LOS: 0 days   Otis Brace  MD, [place] 07/24/2022, 11:35 AM  Contact #  [phone]

## 2022-07-24 NOTE — Plan of Care (Signed)
Discussed with patient plan of care for the evening, pain management and admission question with some teach back displayed.  Problem: Health Behavior/Discharge Planning: Goal: Ability to manage health-related needs will improve Outcome: Progressing   Problem: Education: Goal: Knowledge of General Education information will improve Description: Including pain rating scale, medication(s)/side effects and non-pharmacologic comfort measures Outcome: Progressing

## 2022-07-24 NOTE — Consult Note (Signed)
Referring Provider:  [place] Primary Care Physician:  Carol Ada, MD Primary Gastroenterologist: Dr. Collene Mares  Reason for Consultation: GI bleeding  HPI: [name] is a 66 y.o. female with past medical history of hypertension, diabetes, Parkinson's disease presented to the hospital with rectal bleeding.  Was found to have drop in hemoglobin to 10.4 from baseline 12.  Normal INR, normal LFTs.  Occult blood positive.  Repeat blood work this morning showed hemoglobin down to 8.9.  GI is consulted for further evaluation.  Patient seen and examined at bedside.  Family at bedside.  Patient started noticing bright red blood per rectum yesterday.  Denied any associated abdominal pain, nausea or vomiting.  No previous history of GI bleed.  Subsequently started noticing blood clots along with the bright blood per rectum.  Last bowel movement this morning.  Patient admits using significant NSAIDs.  She takes meloxicam on a daily basis along with as needed Aleve as well as baby aspirin.  Family history of colon cancer in mother in her 58s.  Last colonoscopy several years ago with Dr. Collene Mares.  Report not available to review.   Past Medical History:  Diagnosis Date   Diabetes mellitus    Hypertension    IBS (irritable bowel syndrome)    Obesity    Umbilical hernia     Past Surgical History:  Procedure Laterality Date   ABDOMINAL HYSTERECTOMY  APril j23, 2012   COLONOSCOPY     HERNIA REPAIR  10/11/11   umb hernia   LASER ABLATION OF THE CERVIX     WISDOM TOOTH EXTRACTION      Prior to Admission medications   Medication Sig Start Date End Date Taking? Authorizing Provider  aspirin EC 81 MG tablet Take 81 mg by mouth at bedtime.   Yes [provider]  carbidopa-levodopa (SINEMET IR) 25-100 MG tablet Take 1 tablet by mouth 3 (three) times daily. 05/25/22  Yes [provider]  Cyanocobalamin (VITAMIN B-12 PO) Take 1 tablet by mouth daily.   Yes [provider]   escitalopram (LEXAPRO) 20 MG tablet Take 20 mg by mouth daily. 07/23/22  Yes [provider]  losartan-hydrochlorothiazide (HYZAAR) 50-12.5 MG tablet Take 1 tablet by mouth daily. 05/13/22  Yes [provider]  meloxicam (MOBIC) 15 MG tablet Take 15 mg by mouth daily.   Yes [provider]  metFORMIN (GLUCOPHAGE) 1000 MG tablet Take 1,000 mg by mouth daily with breakfast.   Yes [provider]  Multiple Vitamins-Minerals (MULTIVITAMIN WITH MINERALS) tablet Take 1 tablet by mouth daily.   Yes [provider]  potassium chloride (KLOR-CON M) 10 MEQ tablet Take 10 mEq by mouth daily. 06/01/22  Yes [provider]  simvastatin (ZOCOR) 10 MG tablet Take 10 mg by mouth at bedtime.   Yes [provider]  ondansetron (ZOFRAN ODT) 4 MG disintegrating tablet Take 1 tablet (4 mg total) by mouth every 8 (eight) hours as needed for nausea or vomiting. Patient not taking: Reported on 07/23/2022 02/02/14   Pattricia Boss, MD  oxyCODONE-acetaminophen (PERCOCET/ROXICET) 5-325 MG per tablet Take 1 or 2 po Q 6hrs for pain Patient not taking: Reported on 07/23/2022 02/02/14   Pattricia Boss, MD  tamsulosin [facility]) 0.4 MG CAPS capsule Take 1 po daily until you pass the stone Patient not taking: Reported on 07/23/2022 02/02/14   Pattricia Boss, MD    Scheduled Meds:  carbidopa-levodopa  1 tablet Oral TID   escitalopram  20 mg Oral Daily  insulin aspart  0-9 Units Subcutaneous TID PC & HS   insulin glargine-yfgn  15 Units Subcutaneous QHS   losartan  50 mg Oral Daily   [START ON 07/27/2022] pantoprazole  40 mg Intravenous Q12H   simvastatin  10 mg Oral QHS   Continuous Infusions:  sodium chloride 100 mL/hr at 07/24/22 0801   sodium chloride 10 mL/hr at 07/24/22 0802   pantoprazole Stopped (07/24/22 1110)   potassium chloride 10 mEq (07/24/22 1110)   PRN Meds:.sodium chloride, hydrALAZINE  Allergies as of 07/23/2022 - Review Complete 07/23/2022  Allergen  Reaction Noted   Amoxicillin Anaphylaxis 09/22/2011   Spinach Other (See Comments) 02/02/2014    Family History  Problem Relation Age of Onset   Cancer Mother        colon   Heart disease Mother        heart attack    Social History   Socioeconomic History   Marital status: Married    Spouse name: Not on file   Number of children: Not on file   Years of education: Not on file   Highest education level: Not on file  Occupational History   Not on file  Tobacco Use   Smoking status: Never   Smokeless tobacco: Never  Substance and Sexual Activity   Alcohol use: No   Drug use: No   Sexual activity: Not on file  Other Topics Concern   Not on file  Social History Narrative   Not on file   Social Determinants of Health   Financial Resource Strain: Not on file  Food Insecurity: Not on file  Transportation Needs: Not on file  Physical Activity: Not on file  Stress: Not on file  Social Connections: Not on file  Intimate Partner Violence: Not on file    Review of Systems: All negative except as stated above in HPI.  Physical Exam: Vital signs: Vitals:   07/24/22 0800 07/24/22 1008  BP: (!) 185/68 (!) 156/93  Pulse:  65  Resp: 20 16  Temp:  98.2 F (36.8 C)  SpO2: 100% 98%   Last BM Date : 07/24/22 General:   Alert,  Well-developed, well-nourished, pleasant and cooperative in NAD Lungs:  no visible respiratory distress, anterior exam only Heart:  Regular rate and rhythm; no murmurs, clicks, rubs,  or gallops. Abdomen: Soft, nontender, nondistended, bowel sounds present, no peritoneal signs Psych -mood and affect normal Neuro -alert and oriented x3 Rectal:  Deferred  GI:  Lab Results: Recent Labs    07/23/22 2010 07/24/22 0342  WBC 11.4* 7.9  HGB 10.4* 8.9*  HCT 33.5* 29.3*  PLT 244 191   BMET Recent Labs    07/23/22 2010 07/24/22 0342  NA 142 146*  K 3.7 3.4*  CL 105 115*  CO2 26 26  GLUCOSE 423* 288*  BUN 28* 28*  CREATININE 0.83 0.74   CALCIUM 9.8 9.7   LFT Recent Labs    07/23/22 2010  PROT 6.6  ALBUMIN 3.7  AST 15  ALT 18  ALKPHOS 51  BILITOT 0.6   PT/INR Recent Labs    07/23/22 2010  LABPROT 14.0  INR 1.1     Studies/Results: No results found.  Impression/Plan: -Painless hematochezia.  Likely diverticular bleed in setting of significant NSAID use.  Also need to rule out upper GI bleed because of significant NSAID use. -Significant NSAID use -Family history of colon cancer in mother -Acute blood loss anemia  Recommendations ------------------------- -Plan for EGD and colonoscopy tomorrow -  Continue PPI -ok to have clear liquid diet today.  Keep n.p.o. past midnight.  Risks (bleeding, infection, bowel perforation that could require surgery, sedation-related changes in cardiopulmonary systems), benefits (identification and possible treatment of source of symptoms, exclusion of certain causes of symptoms), and alternatives (watchful waiting, radiographic imaging studies, empiric medical treatment)  were explained to patient/family in detail and patient wishes to proceed.      LOS: 0 days   Otis Brace  MD, [place] 07/24/2022, 11:35 AM  Contact #  [phone]

## 2022-07-24 NOTE — Progress Notes (Signed)
Right LE venous duplex study completed. Please see CV Proc for preliminary results.  Eward Rutigliano BS, RVT 07/24/2022 1:26 PM

## 2022-07-24 NOTE — Assessment & Plan Note (Signed)
CBG of 423 on presentation. -8 units of Novolog 70/30 given in ED -start sensitive SSI

## 2022-07-25 ENCOUNTER — Inpatient Hospital Stay (HOSPITAL_COMMUNITY): Payer: Medicare Other | Admitting: Certified Registered Nurse Anesthetist

## 2022-07-25 ENCOUNTER — Encounter (HOSPITAL_COMMUNITY)
Admission: EM | Disposition: A | Discharge status: Home / Self Care | Payer: Self-pay | Source: Home / Self Care | Attending: Internal Medicine

## 2022-07-25 ENCOUNTER — Encounter (HOSPITAL_COMMUNITY): Payer: Self-pay | Admitting: Family Medicine

## 2022-07-25 DIAGNOSIS — K222 Esophageal obstruction: Secondary | ICD-10-CM | POA: Diagnosis not present

## 2022-07-25 DIAGNOSIS — Z800 Family history of malignant neoplasm of digestive organs: Secondary | ICD-10-CM

## 2022-07-25 DIAGNOSIS — K449 Diaphragmatic hernia without obstruction or gangrene: Secondary | ICD-10-CM

## 2022-07-25 DIAGNOSIS — D175 Benign lipomatous neoplasm of intra-abdominal organs: Secondary | ICD-10-CM

## 2022-07-25 DIAGNOSIS — K635 Polyp of colon: Secondary | ICD-10-CM | POA: Diagnosis not present

## 2022-07-25 DIAGNOSIS — K648 Other hemorrhoids: Secondary | ICD-10-CM

## 2022-07-25 DIAGNOSIS — K922 Gastrointestinal hemorrhage, unspecified: Secondary | ICD-10-CM | POA: Diagnosis not present

## 2022-07-25 DIAGNOSIS — K5730 Diverticulosis of large intestine without perforation or abscess without bleeding: Secondary | ICD-10-CM

## 2022-07-25 HISTORY — PX: COLONOSCOPY WITH PROPOFOL: SHX5780

## 2022-07-25 HISTORY — PX: BIOPSY: SHX5522

## 2022-07-25 HISTORY — PX: ESOPHAGOGASTRODUODENOSCOPY (EGD) WITH PROPOFOL: SHX5813

## 2022-07-25 LAB — GLUCOSE, CAPILLARY
Glucose-Capillary: 135 mg/dL — ABNORMAL HIGH (ref 70–99)
Glucose-Capillary: 137 mg/dL — ABNORMAL HIGH (ref 70–99)
Glucose-Capillary: 228 mg/dL — ABNORMAL HIGH (ref 70–99)
Glucose-Capillary: 200 mg/dL — ABNORMAL HIGH (ref 70–99)
Glucose-Capillary: 251 mg/dL — ABNORMAL HIGH (ref 70–99)

## 2022-07-25 LAB — CBC
HCT: 29 % — ABNORMAL LOW (ref 36.0–46.0)
Hemoglobin: 9.1 g/dL — ABNORMAL LOW (ref 12.0–15.0)
MCH: 29 pg (ref 26.0–34.0)
MCHC: 31.4 g/dL (ref 30.0–36.0)
MCV: 92.4 fL (ref 80.0–100.0)
Platelets: 192 10*3/uL (ref 150–400)
RBC: 3.14 MIL/uL — ABNORMAL LOW (ref 3.87–5.11)
RDW: 15 % (ref 11.5–15.5)
WBC: 8.2 10*3/uL (ref 4.0–10.5)
nRBC: 0 % (ref 0.0–0.2)

## 2022-07-25 LAB — BASIC METABOLIC PANEL
Anion gap: 8 (ref 5–15)
BUN: 5 mg/dL — ABNORMAL LOW (ref 8–23)
CO2: 26 mmol/L (ref 22–32)
Calcium: 8.8 mg/dL — ABNORMAL LOW (ref 8.9–10.3)
Chloride: 109 mmol/L (ref 98–111)
Creatinine, Ser: 0.59 mg/dL (ref 0.44–1.00)
GFR, Estimated: >60 mL/min (ref >60)
Glucose, Bld: 172 mg/dL — ABNORMAL HIGH (ref 70–99)
Potassium: 3.3 mmol/L — ABNORMAL LOW (ref 3.5–5.1)
Sodium: 143 mmol/L (ref 135–145)

## 2022-07-25 LAB — HEMOGLOBIN AND HEMATOCRIT, BLOOD
HCT: 29.1 % — ABNORMAL LOW (ref 36.0–46.0)
Hemoglobin: 9.3 g/dL — ABNORMAL LOW (ref 12.0–15.0)

## 2022-07-25 SURGERY — ESOPHAGOGASTRODUODENOSCOPY (EGD) WITH PROPOFOL
Anesthesia: Monitor Anesthesia Care

## 2022-07-25 MED ORDER — PROPOFOL 10 MG/ML IV BOLUS
INTRAVENOUS | Status: AC
  Filled 2022-07-25: qty 20

## 2022-07-25 MED ORDER — PROPOFOL 500 MG/50ML IV EMUL
INTRAVENOUS | Status: DC | PRN
  Administered 2022-07-25: 125 ug/kg/min via INTRAVENOUS

## 2022-07-25 MED ORDER — LACTATED RINGERS IV SOLN: INTRAVENOUS | Status: DC | PRN

## 2022-07-25 MED ORDER — PROPOFOL 10 MG/ML IV BOLUS
INTRAVENOUS | Status: DC | PRN
  Administered 2022-07-25 (×3): 20 mg via INTRAVENOUS

## 2022-07-25 MED ORDER — LIDOCAINE HCL (CARDIAC) PF 100 MG/5ML IV SOSY
PREFILLED_SYRINGE | INTRAVENOUS | Status: DC | PRN
  Administered 2022-07-25: 80 mg via INTRAVENOUS

## 2022-07-25 MED ORDER — POTASSIUM CHLORIDE CRYS ER 20 MEQ PO TBCR
40.0000 meq | EXTENDED_RELEASE_TABLET | Freq: Once | ORAL | Status: AC
  Administered 2022-07-25: 40 meq via ORAL
  Filled 2022-07-25: qty 2

## 2022-07-25 MED ORDER — PROPOFOL 500 MG/50ML IV EMUL
INTRAVENOUS | Status: AC
  Filled 2022-07-25: qty 50

## 2022-07-25 MED ORDER — PANTOPRAZOLE SODIUM 40 MG PO TBEC
40.0000 mg | DELAYED_RELEASE_TABLET | Freq: Two times a day (BID) | ORAL | Status: DC
  Administered 2022-07-25 – 2022-07-26 (×3): 40 mg via ORAL
  Filled 2022-07-25 (×3): qty 1

## 2022-07-25 SURGICAL SUPPLY — 25 items

## 2022-07-25 NOTE — Anesthesia Postprocedure Evaluation (Signed)
Anesthesia Post Note  Patient: [name]  Procedure(s) Performed: ESOPHAGOGASTRODUODENOSCOPY (EGD) WITH PROPOFOL COLONOSCOPY WITH PROPOFOL BIOPSY     Patient location during evaluation: Endoscopy Anesthesia Type: MAC Level of consciousness: awake and alert Pain management: pain level controlled Vital Signs Assessment: post-procedure vital signs reviewed and stable Respiratory status: spontaneous breathing, nonlabored ventilation and respiratory function stable Cardiovascular status: stable and blood pressure returned to baseline Postop Assessment: no apparent nausea or vomiting Anesthetic complications: no   No notable events documented.  Last Vitals:  Vitals:   07/25/22 0900 07/25/22 0910  BP: (!) 144/83 (!) 164/79  Pulse: 91 88  Resp: 20 20  Temp:  36.7 C  SpO2: 97% 96%    Last Pain:  Vitals:   07/25/22 0910  TempSrc:   PainSc: 0-No pain                 Georg Ang,W. EDMOND

## 2022-07-25 NOTE — Transfer of Care (Signed)
Immediate Anesthesia Transfer of Care Note  Patient: [name]  Procedure(s) Performed: ESOPHAGOGASTRODUODENOSCOPY (EGD) WITH PROPOFOL COLONOSCOPY WITH PROPOFOL BIOPSY  Patient Location: PACU  Anesthesia Type:MAC  Level of Consciousness: awake, alert , oriented and patient cooperative  Airway & Oxygen Therapy: Patient Spontanous Breathing and Patient connected to face mask oxygen  Post-op Assessment: Report given to RN and Post -op Vital signs reviewed and stable  Post vital signs: Reviewed and stable  Last Vitals:  Vitals Value Taken Time  BP 141/83 07/25/22 0845  Temp 36.9 C 07/25/22 0844  Pulse 103 07/25/22 0847  Resp 23 07/25/22 0847  SpO2 100 % 07/25/22 0847  Vitals shown include unvalidated device data.  Last Pain:  Vitals:   07/25/22 0844  TempSrc:   PainSc: Asleep         Complications: No notable events documented.

## 2022-07-25 NOTE — Interval H&P Note (Signed)
History and Physical Interval Note:  07/25/2022 8:07 AM  [name]  has presented today for surgery, with the diagnosis of Anemia, GI bleed.  The various methods of treatment have been discussed with the patient and family. After consideration of risks, benefits and other options for treatment, the patient has consented to  Procedure(s): ESOPHAGOGASTRODUODENOSCOPY (EGD) WITH PROPOFOL (N/A) COLONOSCOPY WITH PROPOFOL (N/A) as a surgical intervention.  The patient's history has been reviewed, patient examined, no change in status, stable for surgery.  I have reviewed the patient's chart and labs.  Questions were answered to the patient's satisfaction.     Cinthia Rodden

## 2022-07-25 NOTE — Progress Notes (Addendum)
PROGRESS NOTE    DOSHA BROSHEARS  WJX:[id number] DOB: 08-06-1956 DOA: 07/23/2022 PCP: Carol Ada, MD   Brief Narrative: 66 year old with past medical history significant for Parkinson disease, hypertension, diabetes type 2 presented with rectal bleeding.  She has been taking Advil and meloxicam on for right lower extremity pain.  She presented with a hemoglobin of 10 down from 21 February 2022. Patient has been admitted for GI bleed, started on Protonix.  [facility] consulted.  Patient underwent endoscopy which showed nonbleeding gastric ulcer, lower esophageal ring and hiatal hernia.  Biopsy taken.  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  No evidence of active bleeding.   Assessment & Plan:   Principal Problem:   Acute GI bleeding Active Problems:   HTN (hypertension)   Type 2 diabetes mellitus with hyperglycemia (HCC)   HLD (hyperlipidemia)   Parkinson disease (HCC)   1-Acute GI bleed: Patient presented with dark red blood per rectum. Hemoglobin on admission at 10, previously 12. Started  Protonix drip. Continue to monitor hemoglobin, transfuse if active bleeding or hemoglobin continues to decrease. [facility] consulted.  Underwent endoscopy and colonoscopy 8/13, which showed nonbleeding gastric ulcer, 2 lipoma in 1 colon polyp.  No evidence of active bleeding. GI recommended to advance diet to soft diet, Protonix 40 mg twice daily for 4 weeks follow  by Protonix 40 mg daily for 68-monthor until repeat endoscopy. Patient needs to follow-up with Dr. MCollene Maresin 2 to 3 months for endoscopy to document healing of gastric ulcer. Colonoscopy in 3 to 5 years depending on pathology. Monitor overnight to make sure patient tolerates diet and hemoglobin remained stable.   2-Parkinson disease: Continue with carbidopa levodopa  3-Hyperlipidemia: Continue with Zocor  Diabetes type 2 with  hyperglycemia: Uncontrolled A1c 8.3 Continue with a sliding scale insulin Continue with Semglee.  Hypertension: Hold hydrochlorothiazide due to hyponatremia.  Continue with Cozaar. As needed hydralazine added.  Mild hypernatremia: Suspect related to dehydration. Resolved with IV fluids.  Hypokalemia: Replete orally.  Right LE pain;  Doppler; negative for DVT  Estimated body mass index is 36.22 kg/m as calculated from the following:   Height as of this encounter: '5\' 6"'$  (1.676 m).   Weight as of this encounter: 101.8 kg.   DVT prophylaxis: SCD Code Status: Full code Family Communication: Care discussed with patient.  Disposition Plan:  Status is: Observation The patient will require care spanning > 2 midnights and should be moved to inpatient because: management of GI bleed.  Home tomorrow if hemoglobin remained stable    Consultants:  GI  Procedures:  None  Antimicrobials:    Subjective: No further bloody bowel movement.  Denies abdominal pain.  Objective: Vitals:   07/24/22 1008 07/24/22 2045 07/24/22 2332 07/25/22 0443  BP: (!) 156/93 (!) 168/96 (!) 160/90 (!) 174/73  Pulse: 65 66 73 83  Resp: '16 18 17 16  '$ Temp: 98.2 F (36.8 C) 98.7 F (37.1 C) 98.2 F (36.8 C) 98.7 F (37.1 C)  TempSrc: Oral Oral Oral Oral  SpO2: 98% 100% 100% 99%  Weight:      Height:        Intake/Output Summary (Last 24 hours) at 07/25/2022 0719 Last data filed at 07/25/2022 0446 Gross per 24 hour  Intake 1695.86 ml  Output 1600 ml  Net 95.86 ml    Filed Weights   07/24/22 0242  Weight: 101.8 kg    Examination:  General exam: NAD Respiratory system: CTA Cardiovascular system: S 1, S 2 RRR Gastrointestinal system: BS present, soft, nt Central nervous system: Alert, follows command Extremities: no edema  Data Reviewed: I have personally reviewed following labs and imaging studies  CBC: Recent Labs  Lab 07/23/22 2010 07/24/22 0342 07/24/22 1228 07/24/22 2134  07/25/22 0400  WBC 11.4* 7.9  --   --  8.2  NEUTROABS 8.8*  --   --   --   --   HGB 10.4* 8.9* 8.9* 9.6* 9.1*  HCT 33.5* 29.3* 28.4* 30.9* 29.0*  MCV 92.8 93.9  --   --  92.4  PLT 244 191  --   --  778    Basic Metabolic Panel: Recent Labs  Lab 07/23/22 2010 07/24/22 0342  NA 142 146*  K 3.7 3.4*  CL 105 115*  CO2 26 26  GLUCOSE 423* 288*  BUN 28* 28*  CREATININE 0.83 0.74  CALCIUM 9.8 9.7    GFR: Estimated Creatinine Clearance: 84.4 mL/min (by C-G formula based on SCr of 0.74 mg/dL). Liver Function Tests: Recent Labs  Lab 07/23/22 2010  AST 15  ALT 18  ALKPHOS 51  BILITOT 0.6  PROT 6.6  ALBUMIN 3.7    No results for input(s): "LIPASE", "AMYLASE" in the last 168 hours. No results for input(s): "AMMONIA" in the last 168 hours. Coagulation Profile: Recent Labs  Lab 07/23/22 2010  INR 1.1    Cardiac Enzymes: No results for input(s): "CKTOTAL", "CKMB", "CKMBINDEX", "TROPONINI" in the last 168 hours. BNP (last 3 results) No results for input(s): "PROBNP" in the last 8760 hours. HbA1C: Recent Labs    07/24/22 0116  HGBA1C 8.3*    CBG: Recent Labs  Lab 07/24/22 0811 07/24/22 1152 07/24/22 1635 07/24/22 2043 07/24/22 2329  GLUCAP 182* 169* 171* 138* 128*    Lipid Profile: No results for input(s): "CHOL", "HDL", "LDLCALC", "TRIG", "CHOLHDL", "LDLDIRECT" in the last 72 hours. Thyroid Function Tests: No results for input(s): "TSH", "T4TOTAL", "FREET4", "T3FREE", "THYROIDAB" in the last 72 hours. Anemia Panel: No results for input(s): "VITAMINB12", "FOLATE", "FERRITIN", "TIBC", "IRON", "RETICCTPCT" in the last 72 hours. Sepsis Labs: No results for input(s): "PROCALCITON", "LATICACIDVEN" in the last 168 hours.  No results found for this or any previous visit (from the past 240 hour(s)).       Radiology Studies: VAS [place] LOWER EXTREMITY VENOUS (DVT)  Result Date: 07/24/2022  Lower Venous DVT Study Patient Name:  TACY CHAVIS [facility]  Date of  Exam:   07/24/2022 Medical Rec #: [id number]             Accession #:    [id number] Date of Birth: [birth year]              Patient Gender: F Patient Age:   66 years Exam Location:  [facility] Procedure:      VAS [place] LOWER EXTREMITY VENOUS (DVT) Referring Phys: Jerald Kief Michie Molnar --------------------------------------------------------------------------------  Indications: Pain.  Comparison Study: No previous exam noted. Performing Technologist: Bobetta Lime BS, RVT  Examination Guidelines: A complete evaluation includes B-mode imaging, spectral Doppler, color Doppler, and power Doppler as needed of all accessible portions of each vessel. Bilateral testing is considered an integral part of a complete examination. Limited examinations for reoccurring indications may be performed as noted. The reflux portion of the exam is performed with the patient in reverse Trendelenburg.  +---------+---------------+---------+-----------+----------+--------------+ RIGHT    CompressibilityPhasicitySpontaneityPropertiesThrombus Aging +---------+---------------+---------+-----------+----------+--------------+ CFV      Full  Yes      Yes                                 +---------+---------------+---------+-----------+----------+--------------+ SFJ      Full                                                        +---------+---------------+---------+-----------+----------+--------------+ FV Prox  Full                                                        +---------+---------------+---------+-----------+----------+--------------+ FV Mid   Full                                                        +---------+---------------+---------+-----------+----------+--------------+ FV DistalFull                                                        +---------+---------------+---------+-----------+----------+--------------+ PFV      Full                                                         +---------+---------------+---------+-----------+----------+--------------+ POP      Full           Yes      Yes                                 +---------+---------------+---------+-----------+----------+--------------+ PTV      Full                                                        +---------+---------------+---------+-----------+----------+--------------+ PERO     Full                                                        +---------+---------------+---------+-----------+----------+--------------+   +----+---------------+---------+-----------+----------+--------------+ LEFTCompressibilityPhasicitySpontaneityPropertiesThrombus Aging +----+---------------+---------+-----------+----------+--------------+ CFV Full           Yes      Yes                                 +----+---------------+---------+-----------+----------+--------------+     Summary: RIGHT: - No evidence of deep vein thrombosis in  the lower extremity. No indirect evidence of obstruction proximal to the inguinal ligament. - No cystic structure found in the popliteal fossa.  LEFT: - No evidence of common femoral vein obstruction.  *See table(s) above for measurements and observations.    Preliminary         Scheduled Meds:  carbidopa-levodopa  1 tablet Oral TID   escitalopram  20 mg Oral Daily   insulin aspart  0-9 Units Subcutaneous TID PC & HS   insulin glargine-yfgn  15 Units Subcutaneous QHS   losartan  50 mg Oral Daily   [START ON 07/27/2022] pantoprazole  40 mg Intravenous Q12H   simvastatin  10 mg Oral QHS   Continuous Infusions:  sodium chloride 100 mL/hr at 07/24/22 1821   sodium chloride 10 mL/hr at 07/24/22 1400   pantoprazole 8 mg/hr (07/24/22 2325)     LOS: 1 day    Time spent: 35 minutes.     Elmarie Shiley, MD [facility]   If 7PM-7AM, please contact night-coverage [email or web address]  07/25/2022, 7:19 AM

## 2022-07-25 NOTE — Brief Op Note (Signed)
07/23/2022 - 07/25/2022  8:48 AM  PATIENT:  [name]  66 y.o. female  PRE-OPERATIVE DIAGNOSIS:  Anemia, GI bleed  POST-OPERATIVE DIAGNOSIS:  EGD: gastric ulcers, bx. COLON: tics, lipomas, bx. hemorrhoids  PROCEDURE:  Procedure(s): ESOPHAGOGASTRODUODENOSCOPY (EGD) WITH PROPOFOL (N/A) COLONOSCOPY WITH PROPOFOL (N/A) BIOPSY  SURGEON:  Surgeon(s) and Role:    * Shloma Roggenkamp, MD - Primary  Findings ----------- -EGD showed nonbleeding gastric ulcer, lower esophageal ring and hiatal hernia.  Biopsies taken. 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  No evidence of active bleeding.  Recommendations ------------------------- -Soft diet and advance as tolerated -Avoid NSAIDs -Pantoprazole 40 mg twice a day for 4 weeks followed by pantoprazole 40 mg once a day for 2 months or until repeat EGD is done -Repeat EGD in 2 to 3 months with Dr. Collene Mares to document healing of gastric ulcer -Repeat colonoscopy in 3 to 5 years depending on pathology - No Further inpatient GI work-up planned.  GI will sign off.  Call us back if needed  Otis Brace MD, [facility] 07/25/2022, 8:50 AM  Contact #  [phone]

## 2022-07-25 NOTE — Op Note (Signed)
[facility] Patient Name: [name] Procedure Date: 07/25/2022 MRN: [id number] Attending MD: Otis Brace , MD Date of Birth: 17-Sep-1956 CSN: [id number] Age: 66 Admit Type: Inpatient Procedure:                Upper GI endoscopy Indications:              Recent gastrointestinal bleeding Providers:                Otis Brace, MD, Grace Isaac, RN, [facility]                            Technician, Technician Referring MD:              Medicines:                Sedation Administered by an Anesthesia Professional Complications:            No immediate complications. Estimated Blood Loss:     Estimated blood loss was minimal. Procedure:                Pre-Anesthesia Assessment:                           - Prior to the procedure, a History and Physical                            was performed, and patient medications and                            allergies were reviewed. The patient's tolerance of                            previous anesthesia was also reviewed. The risks                            and benefits of the procedure and the sedation                            options and risks were discussed with the patient.                            All questions were answered, and informed consent                            was obtained. Prior Anticoagulants: The patient has                            taken no previous anticoagulant or antiplatelet                            agents except for aspirin. ASA Grade Assessment:                            III - A patient with severe systemic disease. After  reviewing the risks and benefits, the patient was                            deemed in satisfactory condition to undergo the                            procedure.                           After obtaining informed consent, the endoscope was                            passed under direct vision. Throughout the                             procedure, the patient's blood pressure, pulse, and                            oxygen saturations were monitored continuously. The                            [id number]-[id number] ([id number]) Olympus endoscope was introduced                            through the mouth, and advanced to the second part                            of duodenum. The upper GI endoscopy was                            accomplished without difficulty. The patient                            tolerated the procedure well. Scope In: Scope Out: Findings:      A widely patent Schatzki ring was found at the gastroesophageal junction.      A small hiatal hernia was present.      One non-bleeding superficial gastric ulcer with no stigmata of bleeding       was found at the incisura. The lesion was 8 mm in largest dimension.       Biopsies were taken with a cold forceps for histology.      The cardia and gastric fundus were normal on retroflexion.      The duodenal bulb, first portion of the duodenum and second portion of       the duodenum were normal. Impression:               - Widely patent Schatzki ring.                           - Small hiatal hernia.                           - Non-bleeding gastric ulcer with no stigmata of  bleeding. Biopsied.                           - Normal duodenal bulb, first portion of the                            duodenum and second portion of the duodenum. Moderate Sedation:      Moderate (conscious) sedation was personally administered by an       anesthesia professional. The following parameters were monitored: oxygen       saturation, heart rate, blood pressure, and response to care. Recommendation:           - Resume previous diet.                           - Continue present medications.                           - Await pathology results.                           - Repeat upper endoscopy in 3 months to check                            healing.                            - No ibuprofen, naproxen, or other non-steroidal                            anti-inflammatory drugs. Procedure Code(s):        --- Professional ---                           [phone], Esophagogastroduodenoscopy, flexible,                            transoral; with biopsy, single or multiple Diagnosis Code(s):        --- Professional ---                           K22.2, Esophageal obstruction                           K44.9, Diaphragmatic hernia without obstruction or                            gangrene                           K25.9, Gastric ulcer, unspecified as acute or                            chronic, without hemorrhage or perforation                           K92.2, Gastrointestinal hemorrhage, unspecified CPT copyright 2019 American Medical Association. All rights reserved. The codes documented in this report  are preliminary and upon coder review may  be revised to meet current compliance requirements. Otis Brace, MD Otis Brace, MD 07/25/2022 8:42:48 AM Number of Addenda: 0

## 2022-07-25 NOTE — Anesthesia Preprocedure Evaluation (Addendum)
Anesthesia Evaluation  Patient identified by MRN, date of birth, ID band Patient awake    Reviewed: Allergy & Precautions, H&P , NPO status , Patient's Chart, lab work & pertinent test results  Airway Mallampati: III  TM Distance: >3 FB Neck ROM: Full    Dental no notable dental hx. (+) Teeth Intact, Dental Advisory Given   Pulmonary neg pulmonary ROS,    Pulmonary exam normal breath sounds clear to auscultation       Cardiovascular hypertension, Pt. on medications  Rhythm:Regular Rate:Normal     Neuro/Psych  Neuromuscular disease negative psych ROS   GI/Hepatic negative GI ROS, Neg liver ROS,   Endo/Other  diabetes, Type 2, Oral Hypoglycemic AgentsMorbid obesity  Renal/GU negative Renal ROS  negative genitourinary   Musculoskeletal   Abdominal   Peds  Hematology negative hematology ROS (+)   Anesthesia Other Findings   Reproductive/Obstetrics negative OB ROS                            Anesthesia Physical Anesthesia Plan  ASA: 3  Anesthesia Plan: MAC   Post-op Pain Management: Minimal or no pain anticipated   Induction: Intravenous  PONV Risk Score and Plan: 2 and Propofol infusion and Treatment may vary due to age or medical condition  Airway Management Planned: Natural Airway and Simple Face Mask  Additional Equipment:   Intra-op Plan:   Post-operative Plan:   Informed Consent: I have reviewed the patients History and Physical, chart, labs and discussed the procedure including the risks, benefits and alternatives for the proposed anesthesia with the patient or authorized representative who has indicated his/her understanding and acceptance.     Dental advisory given  Plan Discussed with: CRNA  Anesthesia Plan Comments:         Anesthesia Quick Evaluation

## 2022-07-25 NOTE — Op Note (Signed)
[facility] Patient Name: [name] Procedure Date: 07/25/2022 MRN: [id number] Attending MD: Otis Brace , MD Date of Birth: [birth year] CSN: [id number] Age: 66 Admit Type: Inpatient Procedure:                Colonoscopy Indications:              Last colonoscopy: date unknown (unable to locate                            last colonoscopy report), Gastrointestinal                            bleeding, Family history of colon cancer in a                            first-degree relative before age 17 years Providers:                Otis Brace, MD, Grace Isaac, RN, [facility] Dyer                            Technician, Technician Referring MD:              Medicines:                Sedation Administered by an Anesthesia Professional Complications:            No immediate complications. Estimated Blood Loss:     Estimated blood loss was minimal. Procedure:                Pre-Anesthesia Assessment:                           - Prior to the procedure, a History and Physical                            was performed, and patient medications and                            allergies were reviewed. The patient's tolerance of                            previous anesthesia was also reviewed. The risks                            and benefits of the procedure and the sedation                            options and risks were discussed with the patient.                            All questions were answered, and informed consent                            was obtained. Prior Anticoagulants: The patient has  taken no previous anticoagulant or antiplatelet                            agents except for aspirin. ASA Grade Assessment:                            III - A patient with severe systemic disease. After                            reviewing the risks and benefits, the patient was                            deemed in satisfactory condition to  undergo the                            procedure.                           After obtaining informed consent, the colonoscope                            was passed under direct vision. Throughout the                            procedure, the patient's blood pressure, pulse, and                            oxygen saturations were monitored continuously. The                            [id number]-HQ190L ([id number]) Olympus colonoscope was                            introduced through the anus and advanced to the the                            terminal ileum, with identification of the                            appendiceal orifice and IC valve. The colonoscopy                            was performed without difficulty. The patient                            tolerated the procedure well. The quality of the                            bowel preparation was fair. Scope In: 8:23:52 AM Scope Out: 8:36:41 AM Scope Withdrawal Time: 0 hours 8 minutes 41 seconds  Total Procedure Duration: 0 hours 12 minutes 49 seconds  Findings:      Hemorrhoids were found on perianal exam.      The terminal ileum appeared normal.      There was a medium-sized  lipoma, in the transverse colon and at the       hepatic flexure. Biopsies were taken with a cold forceps for histology.      A 3 mm polyp was found in the transverse colon. The polyp was sessile.       The polyp was removed with a cold biopsy forceps. Resection and       retrieval were complete.      Multiple small and large-mouthed diverticula were found in the entire       colon.      Internal hemorrhoids were found during retroflexion. The hemorrhoids       were medium-sized. Impression:               - Preparation of the colon was fair.                           - Hemorrhoids found on perianal exam.                           - The examined portion of the ileum was normal.                           - Medium-sized lipoma in the transverse colon and                             at the hepatic flexure. Biopsied.                           - One 3 mm polyp in the transverse colon, removed                            with a cold biopsy forceps. Resected and retrieved.                           - Diverticulosis in the entire examined colon.                           - Internal hemorrhoids. Moderate Sedation:      Moderate (conscious) sedation was personally administered by an       anesthesia professional. The following parameters were monitored: oxygen       saturation, heart rate, blood pressure, and response to care. Recommendation:           - Return patient to hospital ward for ongoing care.                           - Resume previous diet.                           - Continue present medications.                           - Repeat colonoscopy in 3 - 5 years for                            surveillance.                           -  Return to GI office/ Dr. Collene Mares in 2 months. Procedure Code(s):        --- Professional ---                           [phone], Colonoscopy, flexible; with biopsy, single                            or multiple Diagnosis Code(s):        --- Professional ---                           K64.8, Other hemorrhoids                           D17.5, Benign lipomatous neoplasm of                            intra-abdominal organs                           K63.5, Polyp of colon                           K92.2, Gastrointestinal hemorrhage, unspecified                           Z80.0, Family history of malignant neoplasm of                            digestive organs                           K57.30, Diverticulosis of large intestine without                            perforation or abscess without bleeding CPT copyright 2019 American Medical Association. All rights reserved. The codes documented in this report are preliminary and upon coder review may  be revised to meet current compliance requirements. Otis Brace, MD Otis Brace,  MD 07/25/2022 8:47:18 AM Number of Addenda: 0

## 2022-07-26 ENCOUNTER — Encounter (HOSPITAL_COMMUNITY): Payer: Self-pay | Admitting: Gastroenterology

## 2022-07-26 DIAGNOSIS — K922 Gastrointestinal hemorrhage, unspecified: Secondary | ICD-10-CM | POA: Diagnosis not present

## 2022-07-26 LAB — CBC
HCT: 27.5 % — ABNORMAL LOW (ref 36.0–46.0)
Hemoglobin: 8.7 g/dL — ABNORMAL LOW (ref 12.0–15.0)
MCH: 29.2 pg (ref 26.0–34.0)
MCHC: 31.6 g/dL (ref 30.0–36.0)
MCV: 92.3 fL (ref 80.0–100.0)
Platelets: 188 10*3/uL (ref 150–400)
RBC: 2.98 MIL/uL — ABNORMAL LOW (ref 3.87–5.11)
RDW: 15.1 % (ref 11.5–15.5)
WBC: 6.6 10*3/uL (ref 4.0–10.5)
nRBC: 0 % (ref 0.0–0.2)

## 2022-07-26 LAB — BASIC METABOLIC PANEL
Anion gap: 6 (ref 5–15)
BUN: 8 mg/dL (ref 8–23)
CO2: 27 mmol/L (ref 22–32)
Calcium: 8.8 mg/dL — ABNORMAL LOW (ref 8.9–10.3)
Chloride: 109 mmol/L (ref 98–111)
Creatinine, Ser: 0.61 mg/dL (ref 0.44–1.00)
GFR, Estimated: >60 mL/min (ref >60)
Glucose, Bld: 202 mg/dL — ABNORMAL HIGH (ref 70–99)
Potassium: 3.8 mmol/L (ref 3.5–5.1)
Sodium: 142 mmol/L (ref 135–145)

## 2022-07-26 LAB — FERRITIN: Ferritin: 28 ng/mL (ref 11–307)

## 2022-07-26 LAB — GLUCOSE, CAPILLARY
Glucose-Capillary: 168 mg/dL — ABNORMAL HIGH (ref 70–99)
Glucose-Capillary: 261 mg/dL — ABNORMAL HIGH (ref 70–99)

## 2022-07-26 LAB — IRON AND TIBC
Iron: 30 ug/dL (ref 28–170)
Saturation Ratios: 10 % — ABNORMAL LOW (ref 10.4–31.8)
TIBC: 289 ug/dL (ref 250–450)
UIBC: 259 ug/dL

## 2022-07-26 MED ORDER — SODIUM CHLORIDE 0.9 % IV SOLN: INTRAVENOUS | Status: DC

## 2022-07-26 MED ORDER — LOSARTAN POTASSIUM 50 MG PO TABS: 50.0000 mg | ORAL_TABLET | Freq: Every day | ORAL | 0 refills | Status: AC

## 2022-07-26 MED ORDER — PANTOPRAZOLE SODIUM 40 MG PO TBEC: 40.0000 mg | DELAYED_RELEASE_TABLET | Freq: Two times a day (BID) | ORAL | 2 refills | Status: AC

## 2022-07-26 NOTE — Progress Notes (Signed)
Mobility Specialist - Progress Note    07/26/22 1100  Mobility  Activity Ambulated with assistance in hallway  Level of Assistance Standby assist, set-up cues, supervision of patient - no hands on  Assistive Device Front wheel walker  Distance Ambulated (ft) 120 ft  Activity Response Tolerated well  $Mobility charge 1 Mobility   Pt was found in recliner chair and was agreeable to mobilize. Pt needed verbal and physical cues when turning around. Upon returning to room Pt was left in recliner chair with all necessities in reach.  Ferd Hibbs Mobility Specialist

## 2022-07-26 NOTE — TOC Transition Note (Addendum)
Transition of Care [facility]) - CM/SW Discharge Note   Patient Details  Name: AVANTHIKA DEHNERT MRN: [id number] Date of Birth: [birth year]  Transition of Care [facility]) CM/SW Contact:  Leeroy Cha, RN Phone Number: 07/26/2022, 11:19 AM   Clinical Narrative:    Patient wanting additional cleaning help-informed that this is private pay and we do not set up.  Want hhc pt and ot-note sent to dr Tyrell Antonio.  Wants transportation but does have a husband that could come and get her. Spoke with patient the hhc -pt- is ordered by her neurologist and will continue.  Understand that cleaning and help with adl's-cooking is private pay.  Husband is coming to pick up patient this afternoon.  Final next level of care: Home/Self Care Barriers to Discharge: Barriers Resolved   Patient Goals and CMS Choice Patient states their goals for this hospitalization and ongoing recovery are:: to go home CMS Medicare.gov Compare Post Acute Care list provided to:: Patient Choice offered to / list presented to : Patient  Discharge Placement                       Discharge Plan and Services   Discharge Planning Services: CM Consult                                 Social Determinants of Health (SDOH) Interventions     Readmission Risk Interventions     No data to display

## 2022-07-26 NOTE — Progress Notes (Signed)
Patient has been read and explained discharge instructions. Patient has no further questions at this time. IV has been removed; site is clean, dry and intact.  Layla Maw, RN

## 2022-07-26 NOTE — TOC Initial Note (Signed)
Transition of Care [facility]) - Initial/Assessment Note    Patient Details  Name: [name] MRN: [id number] Date of Birth: [birth year]  Transition of Care [facility]) CM/SW Contact:    Leeroy Cha, RN Phone Number: 07/26/2022, 8:33 AM  Clinical Narrative:                  Transition of Care [facility]) Screening Note   Patient Details  Name: [name] Date of Birth: 1956-11-26   Transition of Care [facility]) CM/SW Contact:    Leeroy Cha, RN Phone Number: 07/26/2022, 8:33 AM    Transition of Care [facility]) has reviewed patient and no TOC needs have been identified at this time. We will continue to monitor patient advancement through interdisciplinary progression rounds. If new patient transition needs arise, please place a TOC consult.    Expected Discharge Plan: Home/Self Care Barriers to Discharge: Continued Medical Work up   Patient Goals and CMS Choice Patient states their goals for this hospitalization and ongoing recovery are:: to go home CMS Medicare.gov Compare Post Acute Care list provided to:: Patient Choice offered to / list presented to : Patient  Expected Discharge Plan and Services Expected Discharge Plan: Home/Self Care   Discharge Planning Services: CM Consult   Living arrangements for the past 2 months: Single Family Home                                      Prior Living Arrangements/Services Living arrangements for the past 2 months: Single Family Home Lives with:: Spouse Patient language and need for interpreter reviewed:: Yes Do you feel safe going back to the place where you live?: Yes            Criminal Activity/Legal Involvement Pertinent to Current Situation/Hospitalization: No - Comment as needed  Activities of Daily Living Home Assistive Devices/Equipment: Cane (specify quad or straight), Eyeglasses, Walker (specify type) ADL Screening (condition at time of admission) Patient's cognitive ability adequate  to safely complete daily activities?: Yes Is the patient deaf or have difficulty hearing?: No Does the patient have difficulty seeing, even when wearing glasses/contacts?: Yes Does the patient have difficulty concentrating, remembering, or making decisions?: No Patient able to express need for assistance with ADLs?: Yes Does the patient have difficulty dressing or bathing?: No Independently performs ADLs?: Yes (appropriate for developmental age) Does the patient have difficulty walking or climbing stairs?: Yes Weakness of Legs: None Weakness of Arms/Hands: None  Permission Sought/Granted                  Emotional Assessment Appearance:: Appears stated age Attitude/Demeanor/Rapport: Engaged Affect (typically observed): Calm Orientation: : Oriented to Place, Oriented to Self, Oriented to  Time, Oriented to Situation Alcohol / Substance Use: Never Used Psych Involvement: No (comment)  Admission diagnosis:  Rectal bleeding [K62.5] Acute GI bleeding [K92.2] Hyperglycemia [R73.9] Patient Active Problem List   Diagnosis Date Noted   Acute GI bleeding 07/23/2022   HTN (hypertension) 07/23/2022   Type 2 diabetes mellitus with hyperglycemia ([place]) 07/23/2022   HLD (hyperlipidemia) 07/23/2022   Parkinson disease ([place]) 46/50/3546   Umbilical hernia 56/81/2751   PCP:  Carol Ada, MD Pharmacy:   [id number] [place], [place] [address] [place] [address] Phone: [phone] Fax: [phone]  [facility] [phone] [place], [place] -  [address] [place] [address] Phone: [phone] Fax: [phone]  [facility] [phone] - [place], [address] AT [place] [address] Phone: [phone] Fax: [phone]     Social  Determinants of Health (SDOH) Interventions    Readmission Risk Interventions     No data to display

## 2022-07-26 NOTE — TOC Transition Note (Signed)
Transition of Care [facility]) - CM/SW Discharge Note   Patient Details  Name: ELIZ NIGG MRN: [id number] Date of Birth: 04/22/1956  Transition of Care [facility]) CM/SW Contact:  Leeroy Cha, RN Phone Number: 07/26/2022, 9:55 AM   Clinical Narrative:    Patient dcd to return home with self care.  No toc needs present.   Final next level of care: Home/Self Care Barriers to Discharge: Barriers Resolved   Patient Goals and CMS Choice Patient states their goals for this hospitalization and ongoing recovery are:: to go home CMS Medicare.gov Compare Post Acute Care list provided to:: Patient Choice offered to / list presented to : Patient  Discharge Placement                       Discharge Plan and Services   Discharge Planning Services: CM Consult                                 Social Determinants of Health (SDOH) Interventions     Readmission Risk Interventions     No data to display

## 2022-07-26 NOTE — Discharge Summary (Signed)
Physician Discharge Summary   Patient: [name] MRN: [id number] DOB: 06/30/1956  Admit date:     07/23/2022  Discharge date: 07/26/22  Discharge Physician: Elmarie Shiley   PCP: Carol Ada, MD   Recommendations at discharge:    Needs repeat CBC, consider start iron supplement on follow up./ Needs to follow up with Dr Collene Mares in 6 weeks for repeat Endoscopy./  Consider resumption of HCTZ if no further hypernatremia.   Discharge Diagnoses: Principal Problem:   Acute GI bleeding Active Problems:   HTN (hypertension)   Type 2 diabetes mellitus with hyperglycemia (H[facility])   HLD (hyperlipidemia)   Parkinson disease ([place])  Resolved Problems:   * No resolved hospital problems. *  Hospital Course: 66 year old with past medical history significant for Parkinson disease, hypertension, diabetes type 2 presented with rectal bleeding.  She has been taking Advil and meloxicam on for right lower extremity pain.  She presented with a hemoglobin of 10 down from 21 February 2022.  Patient has been admitted for GI bleed, started on Protonix.  [facility] consulted.  Patient underwent endoscopy which showed nonbleeding gastric ulcer, lower esophageal ring and hiatal hernia.  Biopsy taken.  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  No evidence of active bleeding. Hb remain stable. No further bleeding. Hypernatremia resolved with fluids. She is stable for discharge today.       Assessment and Plan:   1-Acute GI bleed: Patient presented with dark red blood per rectum. Hemoglobin on admission at 10, previously 12. Started  Protonix drip. Continue to monitor hemoglobin, transfuse if active bleeding or hemoglobin continues to decrease. [facility] consulted.  Underwent endoscopy and colonoscopy 8/13, which showed nonbleeding gastric ulcer, 2 lipoma in 1 colon polyp.  No evidence of active bleeding. GI  recommended to advance diet to soft diet, Protonix 40 mg twice daily for 4 weeks follow  by Protonix 40 mg daily for 61-monthor until repeat endoscopy. Patient needs to follow-up with Dr. MCollene Maresin 2 to 3 months for endoscopy to document healing of gastric ulcer. Colonoscopy in 3 to 5 years depending on pathology. No further bleeding hb relatively stable at 8.7----previous 9.3---8.9     2-Parkinson disease: Continue with carbidopa levodopa   3-Hyperlipidemia: Continue with Zocor   Diabetes type 2 with hyperglycemia: Uncontrolled A1c 8.3 Continue with a sliding scale insulin Continue with Semglee.   Hypertension: Hold hydrochlorothiazide due to hypernatremia.  Continue with Cozaar. As needed hydralazine added.   Mild hypernatremia: Suspect related to dehydration. Resolved with IV fluids.   Hypokalemia: Replaced.    Right LE pain;  Doppler; negative for DVT         Consultants: GI, [facility].  Procedures performed: Endoscopy/colonoscopy  Disposition: Home Diet recommendation:  Discharge Diet Orders (From admission, onward)     Start     Ordered   07/26/22 0000  Diet - low sodium heart healthy        07/26/22 0931           Carb modified diet DISCHARGE MEDICATION: Allergies as of 07/26/2022       Reactions   Amoxicillin Anaphylaxis   Spinach Other (See Comments)   Reaction: blistering of the lips        Medication List     STOP taking these medications    aspirin EC 81 MG tablet   losartan-hydrochlorothiazide 50-12.5 MG tablet Commonly known as: HYZAAR  meloxicam 15 MG tablet Commonly known as: MOBIC   oxyCODONE-acetaminophen 5-325 MG tablet Commonly known as: PERCOCET/ROXICET   tamsulosin 0.4 MG Caps capsule Commonly known as: Flomax       TAKE these medications    carbidopa-levodopa 25-100 MG tablet Commonly known as: SINEMET IR Take 1 tablet by mouth 3 (three) times daily.   escitalopram 20 MG tablet Commonly known as: LEXAPRO Take  20 mg by mouth daily.   losartan 50 MG tablet Commonly known as: COZAAR Take 1 tablet (50 mg total) by mouth daily. Start taking on: July 27, 2022   metFORMIN 1000 MG tablet Commonly known as: GLUCOPHAGE Take 1,000 mg by mouth daily with breakfast.   multivitamin with minerals tablet Take 1 tablet by mouth daily.   ondansetron 4 MG disintegrating tablet Commonly known as: Zofran ODT Take 1 tablet (4 mg total) by mouth every 8 (eight) hours as needed for nausea or vomiting.   pantoprazole 40 MG tablet Commonly known as: PROTONIX Take 1 tablet (40 mg total) by mouth 2 (two) times daily.   potassium chloride 10 MEQ tablet Commonly known as: KLOR-CON M Take 10 mEq by mouth daily.   simvastatin 10 MG tablet Commonly known as: ZOCOR Take 10 mg by mouth at bedtime.   VITAMIN B-12 PO Take 1 tablet by mouth daily.        Follow-up Information     Juanita Craver, MD. Schedule an appointment as soon as possible for a visit in 2 month(s).   Specialty: Gastroenterology Why: Follow-up for gastric ulcer and to arrange for repeat EGD Contact information: [address] [place] [address] [phone]                Discharge Exam: Filed Weights   07/24/22 0242  Weight: 101.8 kg   General; NAD  Condition at discharge: stable  The results of significant diagnostics from this hospitalization (including imaging, microbiology, ancillary and laboratory) are listed below for reference.   Imaging Studies: VAS [place] LOWER EXTREMITY VENOUS (DVT)  Result Date: 07/25/2022  Lower Venous DVT Study Patient Name:  [name] [facility]  Date of Exam:   07/24/2022 Medical Rec #: [id number]             Accession #:    [id number] Date of Birth: 01-10-1956              Patient Gender: F Patient Age:   49 years Exam Location:  [facility] Procedure:      VAS [place] LOWER EXTREMITY VENOUS (DVT) Referring Phys: Jerald Kief Dmoni Fortson  --------------------------------------------------------------------------------  Indications: Pain.  Comparison Study: No previous exam noted. Performing Technologist: Bobetta Lime BS, RVT  Examination Guidelines: A complete evaluation includes B-mode imaging, spectral Doppler, color Doppler, and power Doppler as needed of all accessible portions of each vessel. Bilateral testing is considered an integral part of a complete examination. Limited examinations for reoccurring indications may be performed as noted. The reflux portion of the exam is performed with the patient in reverse Trendelenburg.  +---------+---------------+---------+-----------+----------+--------------+ RIGHT    CompressibilityPhasicitySpontaneityPropertiesThrombus Aging +---------+---------------+---------+-----------+----------+--------------+ CFV      Full           Yes      Yes                                 +---------+---------------+---------+-----------+----------+--------------+ SFJ      Full                                                        +---------+---------------+---------+-----------+----------+--------------+  FV Prox  Full                                                        +---------+---------------+---------+-----------+----------+--------------+ FV Mid   Full                                                        +---------+---------------+---------+-----------+----------+--------------+ FV DistalFull                                                        +---------+---------------+---------+-----------+----------+--------------+ PFV      Full                                                        +---------+---------------+---------+-----------+----------+--------------+ POP      Full           Yes      Yes                                 +---------+---------------+---------+-----------+----------+--------------+ PTV      Full                                                         +---------+---------------+---------+-----------+----------+--------------+ PERO     Full                                                        +---------+---------------+---------+-----------+----------+--------------+   +----+---------------+---------+-----------+----------+--------------+ LEFTCompressibilityPhasicitySpontaneityPropertiesThrombus Aging +----+---------------+---------+-----------+----------+--------------+ CFV Full           Yes      Yes                                 +----+---------------+---------+-----------+----------+--------------+     Summary: RIGHT: - No evidence of deep vein thrombosis in the lower extremity. No indirect evidence of obstruction proximal to the inguinal ligament. - No cystic structure found in the popliteal fossa.  LEFT: - No evidence of common femoral vein obstruction.  *See table(s) above for measurements and observations. Electronically signed by Orlie Pollen on 07/25/2022 at 10:41:57 AM.    Final     Microbiology: Results for orders placed or performed in visit on 06/05/19  Novel Coronavirus, NAA ([facility])     Status: None   Collection Time: 06/05/19 12:00 AM  Result Value Ref Range Status   SARS-CoV-2, NAA Not Detected Not Detected Final  Comment: Testing was performed using the cobas(R) SARS-CoV-2 test. This test was developed and its performance characteristics determined by [employer]. This test has not been FDA cleared or approved. This test has been authorized by FDA under an Emergency Use Authorization (EUA). This test is only authorized for the duration of time the declaration that circumstances exist justifying the authorization of the emergency use of in vitro diagnostic tests for detection of SARS-CoV-2 virus and/or diagnosis of COVID-19 infection under section 564(b)(1) of the Act, 21 U.S.C. 144RXV-4(M)(0), unless the authorization is terminated or revoked sooner. When diagnostic  testing is negative, the possibility of a false negative result should be considered in the context of a patient's recent exposures and the presence of clinical signs and symptoms consistent with COVID-19. An individual without symptoms of COVID-19 and who is not shedding SARS-CoV-2 virus would expect to have a negati ve (not detected) result in this assay.     Labs: CBC: Recent Labs  Lab 07/23/22 2010 07/24/22 0342 07/24/22 1228 07/24/22 2134 07/25/22 0400 07/25/22 1217 07/26/22 0329  WBC 11.4* 7.9  --   --  8.2  --  6.6  NEUTROABS 8.8*  --   --   --   --   --   --   HGB 10.4* 8.9* 8.9* 9.6* 9.1* 9.3* 8.7*  HCT 33.5* 29.3* 28.4* 30.9* 29.0* 29.1* 27.5*  MCV 92.8 93.9  --   --  92.4  --  92.3  PLT 244 191  --   --  192  --  867   Basic Metabolic Panel: Recent Labs  Lab 07/23/22 2010 07/24/22 0342 07/25/22 0935 07/26/22 0329  NA 142 146* 143 142  K 3.7 3.4* 3.3* 3.8  CL 105 115* 109 109  CO2 '26 26 26 27  '$ GLUCOSE 423* 288* 172* 202*  BUN 28* 28* 5* 8  CREATININE 0.83 0.74 0.59 0.61  CALCIUM 9.8 9.7 8.8* 8.8*   Liver Function Tests: Recent Labs  Lab 07/23/22 2010  AST 15  ALT 18  ALKPHOS 51  BILITOT 0.6  PROT 6.6  ALBUMIN 3.7   CBG: Recent Labs  Lab 07/25/22 0844 07/25/22 1203 07/25/22 1631 07/25/22 2027 07/26/22 0740  GLUCAP 137* 228* 200* 251* 168*    Discharge time spent: greater than 30 minutes.  Signed: Elmarie Shiley, MD [facility] 07/26/2022

## 2022-07-26 NOTE — Progress Notes (Signed)
PT Cancellation Note / Screen  Patient Details Name: [name] MRN: [id number] DOB: January 07, 1956   Cancelled Treatment:    Reason Eval/Treat Not Completed: PT screened, no needs identified, will sign off Pt ambulating with mobility specialist on arrival.  Pt states she has L AFO at home and encouraged her to wear this upon d/c as she states she is having more difficulty with left foot clearance today. Pt denies any recent falls. Pt lives at home with her husband, no stairs.  Pt reports she was going to OP neuro PT however has had transportation issues and requesting HHPT at this time.  Pt also interested in more home care such as cleaning.  Pt to d/c home today so notified TOC via secure chat of pt requests.   Kati L Payson 07/26/2022, 11:12 AM Arlyce Dice, DPT Physical Therapist Acute Rehabilitation Services Preferred contact method: Secure Chat Weekend Pager Only: [phone] Office: [phone]

## 2022-07-27 LAB — SURGICAL PATHOLOGY

## 2024-10-13 ENCOUNTER — Other Ambulatory Visit: Payer: Self-pay

## 2024-10-13 ENCOUNTER — Encounter (HOSPITAL_COMMUNITY): Payer: Self-pay

## 2024-10-13 ENCOUNTER — Emergency Department (HOSPITAL_COMMUNITY): Admission: EM | Admit: 2024-10-13 | Discharge: 2024-10-13 | Disposition: A | Discharge status: Home / Self Care

## 2024-10-13 ENCOUNTER — Emergency Department (HOSPITAL_COMMUNITY)

## 2024-10-13 DIAGNOSIS — I10 Essential (primary) hypertension: Secondary | ICD-10-CM | POA: Diagnosis not present

## 2024-10-13 DIAGNOSIS — W0110XA Fall on same level from slipping, tripping and stumbling with subsequent striking against unspecified object, initial encounter: Secondary | ICD-10-CM | POA: Diagnosis not present

## 2024-10-13 DIAGNOSIS — G20C Parkinsonism, unspecified: Secondary | ICD-10-CM | POA: Diagnosis not present

## 2024-10-13 DIAGNOSIS — Z79899 Other long term (current) drug therapy: Secondary | ICD-10-CM | POA: Insufficient documentation

## 2024-10-13 DIAGNOSIS — Y92009 Unspecified place in unspecified non-institutional (private) residence as the place of occurrence of the external cause: Secondary | ICD-10-CM | POA: Insufficient documentation

## 2024-10-13 DIAGNOSIS — M25561 Pain in right knee: Secondary | ICD-10-CM | POA: Diagnosis present

## 2024-10-13 DIAGNOSIS — E119 Type 2 diabetes mellitus without complications: Secondary | ICD-10-CM | POA: Diagnosis not present

## 2024-10-13 DIAGNOSIS — S8001XA Contusion of right knee, initial encounter: Secondary | ICD-10-CM | POA: Diagnosis not present

## 2024-10-13 DIAGNOSIS — S0990XA Unspecified injury of head, initial encounter: Secondary | ICD-10-CM | POA: Insufficient documentation

## 2024-10-13 DIAGNOSIS — W19XXXA Unspecified fall, initial encounter: Secondary | ICD-10-CM

## 2024-10-13 DIAGNOSIS — Z7984 Long term (current) use of oral hypoglycemic drugs: Secondary | ICD-10-CM | POA: Insufficient documentation

## 2024-10-13 LAB — URINALYSIS, ROUTINE W REFLEX MICROSCOPIC
Bilirubin Urine: NEGATIVE
Glucose, UA: NEGATIVE mg/dL
Hgb urine dipstick: NEGATIVE
Ketones, ur: NEGATIVE mg/dL
Leukocytes,Ua: NEGATIVE
Nitrite: NEGATIVE
Protein, ur: NEGATIVE mg/dL
Specific Gravity, Urine: 1.008 (ref 1.005–1.030)
pH: 8 (ref 5.0–8.0)

## 2024-10-13 LAB — CBC
HCT: 39.9 % (ref 36.0–46.0)
Hemoglobin: 12.5 g/dL (ref 12.0–15.0)
MCH: 28.9 pg (ref 26.0–34.0)
MCHC: 31.3 g/dL (ref 30.0–36.0)
MCV: 92.1 fL (ref 80.0–100.0)
Platelets: 237 K/uL (ref 150–400)
RBC: 4.33 MIL/uL (ref 3.87–5.11)
RDW: 12.9 % (ref 11.5–15.5)
WBC: 9.4 K/uL (ref 4.0–10.5)
nRBC: 0 % (ref 0.0–0.2)

## 2024-10-13 LAB — BASIC METABOLIC PANEL WITH GFR
Anion gap: 9 (ref 5–15)
BUN: 15 mg/dL (ref 8–23)
CO2: 28 mmol/L (ref 22–32)
Calcium: 9.7 mg/dL (ref 8.9–10.3)
Chloride: 105 mmol/L (ref 98–111)
Creatinine, Ser: 0.74 mg/dL (ref 0.44–1.00)
GFR, Estimated: >60 mL/min (ref >60)
Glucose, Bld: 172 mg/dL — ABNORMAL HIGH (ref 70–99)
Potassium: 3.7 mmol/L (ref 3.5–5.1)
Sodium: 142 mmol/L (ref 135–145)

## 2024-10-13 MED ORDER — CARBIDOPA-LEVODOPA 25-100 MG PO TABS
1.0000 | ORAL_TABLET | Freq: Once | ORAL | Status: DC
Start: 2024-10-13 — End: 2024-10-14

## 2024-10-13 MED ORDER — DICLOFENAC SODIUM 1 % EX GEL
4.0000 g | Freq: Once | CUTANEOUS | Status: AC
  Administered 2024-10-13: 4 g via TOPICAL
  Filled 2024-10-13: qty 100

## 2024-10-13 MED ORDER — LIDOCAINE 5 % EX PTCH: 1.0000 | MEDICATED_PATCH | CUTANEOUS | 0 refills | Status: AC

## 2024-10-13 MED ORDER — ACETAMINOPHEN 325 MG PO TABS
650.0000 mg | ORAL_TABLET | Freq: Once | ORAL | Status: AC
  Administered 2024-10-13: 650 mg via ORAL
  Filled 2024-10-13: qty 2

## 2024-10-13 MED ORDER — LACTATED RINGERS IV BOLUS
1000.0000 mL | Freq: Once | INTRAVENOUS | Status: AC
  Administered 2024-10-13: 1000 mL via INTRAVENOUS

## 2024-10-13 MED ORDER — SODIUM CHLORIDE 0.9 % IV BOLUS
500.0000 mL | Freq: Once | INTRAVENOUS | Status: DC
Start: 2024-10-13 — End: 2024-10-14

## 2024-10-13 NOTE — ED Notes (Addendum)
 Pt stated that she did not want to have anymore IV fluids because she did not want to keep peeing and that she was going to take her Carbidopa -levodopa  that she brought from home.

## 2024-10-13 NOTE — ED Triage Notes (Signed)
 Patient fell Wednesday hitting the back of her head. [place] again today and hit her head. Lost her balance due to bad right knee. No blood thinners. No LOC today. Has bilateral knee pain and head pain.

## 2024-10-13 NOTE — ED Notes (Signed)
 Medications and EKG were delayed due to the pt being CT/X-ray.

## 2024-10-13 NOTE — ED Provider Notes (Signed)
 [facility] AT [facility] Provider Note   CSN: [id number] Arrival date & time: 10/13/24  1547     Patient presents with: [place]   [name] is a 68 y.o. female.   15 old female with past medical history of Parkinson's disease as well as diabetes and hypertension presenting to the emergency department today after a fall at home.  The patient states that she has been feeling a little off balance since Wednesday.  Denies any room spinning dizziness.  She states she fell on Wednesday and did hit her head.  Denies any significant headache since then but has still had the feeling of being off balance.  The patient had a fall again today.  She states that she did hurt her right knee which she has had chronic pain for a while now since Wednesday.  She states that she did hit the skin today and has been having worsening pain since the fall today.  She came to the ER today further evaluation regarding this.  She denies any fevers or chest pain but has had some increased urinary frequency and urgency over the past few days.  Denies any flank pain.   Fall       Prior to Admission medications   Medication Sig Start Date End Date Taking? Authorizing Provider  lidocaine  (LIDODERM ) 5 % Place 1 patch onto the skin daily. Remove & Discard patch within 12 hours or as directed by MD 10/13/24  Yes Ula Prentice SAUNDERS, MD  carbidopa -levodopa  (SINEMET  IR) 25-100 MG tablet Take 1 tablet by mouth 3 (three) times daily. 05/25/22   [provider]  Cyanocobalamin (VITAMIN B-12 PO) Take 1 tablet by mouth daily.    [provider]  escitalopram  (LEXAPRO ) 20 MG tablet Take 20 mg by mouth daily. 07/23/22   [provider]  losartan  (COZAAR ) 50 MG tablet Take 1 tablet (50 mg total) by mouth daily. 07/27/22   Regalado, Belkys A, MD  metFORMIN (GLUCOPHAGE) 1000 MG tablet Take 1,000 mg by mouth daily with breakfast.    [provider]  Multiple  Vitamins-Minerals (MULTIVITAMIN WITH MINERALS) tablet Take 1 tablet by mouth daily.    [provider]  ondansetron  (ZOFRAN  ODT) 4 MG disintegrating tablet Take 1 tablet (4 mg total) by mouth every 8 (eight) hours as needed for nausea or vomiting. Patient not taking: Reported on 07/23/2022 02/02/14   Levander Houston, MD  pantoprazole  (PROTONIX ) 40 MG tablet Take 1 tablet (40 mg total) by mouth 2 (two) times daily. 07/26/22   Regalado, Belkys A, MD  potassium chloride  (KLOR-CON  M) 10 MEQ tablet Take 10 mEq by mouth daily. 06/01/22   [provider]  simvastatin  (ZOCOR ) 10 MG tablet Take 10 mg by mouth at bedtime.    [provider]    Allergies: Amoxicillin, Ciprofloxacin, and Spinach    Review of Systems  Musculoskeletal:  Positive for arthralgias.  All other systems reviewed and are negative.   Updated Vital Signs BP (!) 164/67   Pulse 83   Temp 98.3 F (36.8 C) (Oral)   Resp 18   Ht 5' 6 (1.676 m)   Wt 102.1 kg   SpO2 97%   BMI 36.32 kg/m   Physical Exam Vitals and nursing note reviewed.   Gen: NAD Eyes: PERRL, EOMI HEENT: no oropharyngeal swelling Neck: trachea midline, no midline tenderness, no step-offs or deformities Resp: clear to auscultation bilaterally Card: RRR, no murmurs, rubs, or gallops Abd: nontender, nondistended  Extremities: no calf tenderness, no edema, the patient is tender over the right patella and lateral joint line with no swelling or deformities noted, the knee joint is stable, normal range of motion actively and passively Vascular: 2+ radial pulses bilaterally, 2+ DP pulses bilaterally Neuro: Cranial nerves intact, equal strength and sensation throughout bilateral upper and lower extremities with no dysmetria on finger-to-nose testing Skin: no rashes Psyc: acting appropriately   (all labs ordered are listed, but only abnormal results are displayed) Labs Reviewed  URINALYSIS, ROUTINE W REFLEX MICROSCOPIC - Abnormal; Notable  for the following components:      Result Value   Color, Urine COLORLESS (*)    All other components within normal limits  BASIC METABOLIC PANEL WITH GFR - Abnormal; Notable for the following components:   Glucose, Bld 172 (*)    All other components within normal limits  CBC    EKG: EKG Interpretation Date/Time:  Saturday October 13 2024 16:39:48 EDT Ventricular Rate:  65 PR Interval:  143 QRS Duration:  100 QT Interval:  428 QTC Calculation: 445 R Axis:   9  Text Interpretation: Sinus rhythm Borderline T abnormalities, anterior leads Baseline wander in lead(s) I Confirmed by Ula Barter [phone]) on 10/13/2024 4:43:34 PM  Radiology: [facility] Knee Complete 4 Views Right Result Date: 10/13/2024 CLINICAL DATA:  Clemens, bilateral knee pain EXAM: RIGHT KNEE - COMPLETE 4+ VIEW; LEFT KNEE - COMPLETE 4+ VIEW COMPARISON:  07/05/2023 FINDINGS: Left knee: Frontal, bilateral oblique, lateral views are obtained. No acute fracture, subluxation, or dislocation. There is severe 3 compartmental osteoarthritis greatest in the patellofemoral compartment. No joint effusion. The soft tissues are unremarkable. Right knee: Frontal, bilateral oblique, lateral views are obtained. No acute fracture, subluxation, or dislocation. There is severe 3 compartmental osteoarthritis, greatest in the lateral and patellofemoral compartments. Trace joint effusion. Soft tissues are unremarkable. IMPRESSION: 1. Bilateral severe 3 compartmental osteoarthritis as above. 2. No acute displaced fracture within either knee. Electronically Signed   By: Ozell Daring M.D.   On: 10/13/2024 16:51   DG Knee Complete 4 Views Left Result Date: 10/13/2024 CLINICAL DATA:  Clemens, bilateral knee pain EXAM: RIGHT KNEE - COMPLETE 4+ VIEW; LEFT KNEE - COMPLETE 4+ VIEW COMPARISON:  07/05/2023 FINDINGS: Left knee: Frontal, bilateral oblique, lateral views are obtained. No acute fracture, subluxation, or dislocation. There is severe 3 compartmental  osteoarthritis greatest in the patellofemoral compartment. No joint effusion. The soft tissues are unremarkable. Right knee: Frontal, bilateral oblique, lateral views are obtained. No acute fracture, subluxation, or dislocation. There is severe 3 compartmental osteoarthritis, greatest in the lateral and patellofemoral compartments. Trace joint effusion. Soft tissues are unremarkable. IMPRESSION: 1. Bilateral severe 3 compartmental osteoarthritis as above. 2. No acute displaced fracture within either knee. Electronically Signed   By: Ozell Daring M.D.   On: 10/13/2024 16:51   CT Head Wo Contrast Result Date: 10/13/2024 EXAM: CT HEAD WITHOUT CONTRAST 10/13/2024 04:27:11 PM TECHNIQUE: CT of the head was performed without the administration of intravenous contrast. Automated exposure control, iterative reconstruction, and/or weight based adjustment of the mA/kV was utilized to reduce the radiation dose to as low as reasonably achievable. COMPARISON: 06/03/2020. CLINICAL HISTORY: Facial trauma, blunt. FINDINGS: BRAIN AND VENTRICLES: No acute hemorrhage. No evidence of acute infarct. No hydrocephalus. No extra-axial collection. No mass effect or midline shift. ORBITS: No acute abnormality. SINUSES: No acute abnormality. SOFT TISSUES AND SKULL: No acute soft tissue abnormality. No skull fracture. IMPRESSION: 1. No acute intracranial abnormality. Electronically signed by: Franky  Alexa MD 10/13/2024 04:44 PM EDT RP Workstation: [id number]     Procedures   Medications Ordered in the ED  sodium chloride  0.9 % bolus 500 mL (500 mLs Intravenous Not Given 10/13/24 2049)  carbidopa -levodopa  (SINEMET  IR) 25-100 MG per tablet immediate release 1 tablet (1 tablet Oral Not Given 10/13/24 2049)  lactated ringers  bolus 1,000 mL (0 mLs Intravenous Stopped 10/13/24 2051)  diclofenac Sodium (VOLTAREN) 1 % topical gel 4 g (4 g Topical Given 10/13/24 1743)  acetaminophen  (TYLENOL ) tablet 650 mg (650 mg Oral Given 10/13/24 1655)                                     Medical Decision Making 68 year old female with past medical history of Parkinson's disease and diabetes presenting to the emergency department today after 2 falls within the past week.  I will further evaluate patient here with a CT scan of her head as well as an x-ray of her right knee.  Will obtain basic labs Wels a urinalysis to eval for electrolyte abnormalities, anemia, or UTI respectively.  I will give patient some IV fluids here see if associate her symptoms and treat her pain with Tylenol  as well as Voltaren gel.  Will reevaluate for ultimate disposition.  The patient's labs are reassuring.  We discussed possible observation for PT and SNF placement versus going home.  The patient is ultimately discharged through shared decision making.  CT scan of her head and x-rays are unremarkable.  The patient be discharged with return precautions.  Amount and/or Complexity of Data Reviewed Labs: ordered. Radiology: ordered.  Risk OTC drugs. Prescription drug management.        Final diagnoses:  Fall, initial encounter  Contusion of right knee, initial encounter  Closed head injury, initial encounter    ED Discharge Orders          Ordered    lidocaine  (LIDODERM ) 5 %  Every 24 hours        10/13/24 2256               Ula Prentice SAUNDERS, MD 10/13/24 2258

## 2024-10-13 NOTE — Discharge Instructions (Signed)
 Please try the lidocaine  patches in addition to the Voltaren gel and Tylenol .  Follow-up with your doctor and return to the ER for worsening symptoms.
